# Patient Record
Sex: Female | Born: 1935 | Race: White | Hispanic: No | Marital: Single | Smoking: Never smoker
Health system: Southern US, Community
[De-identification: ages and names within clinical notes are randomized; demographics above are authoritative.]

## PROBLEM LIST (undated history)

## (undated) DIAGNOSIS — C8590 Non-Hodgkin lymphoma, unspecified, unspecified site: Secondary | ICD-10-CM

## (undated) DIAGNOSIS — I4891 Unspecified atrial fibrillation: Secondary | ICD-10-CM

---

## 2017-09-24 ENCOUNTER — Encounter (HOSPITAL_COMMUNITY): Payer: Self-pay | Admitting: Emergency Medicine

## 2017-09-24 ENCOUNTER — Emergency Department (HOSPITAL_COMMUNITY): Payer: Medicare Other

## 2017-09-24 ENCOUNTER — Emergency Department: Payer: Self-pay

## 2017-09-24 ENCOUNTER — Inpatient Hospital Stay (HOSPITAL_COMMUNITY)
Admission: EM | Admit: 2017-09-24 | Discharge: 2017-09-30 | DRG: 563 | Disposition: A | Discharge status: Skilled Nursing Facility | Payer: Medicare Other | Attending: Internal Medicine | Admitting: Internal Medicine

## 2017-09-24 DIAGNOSIS — E871 Hypo-osmolality and hyponatremia: Secondary | ICD-10-CM | POA: Diagnosis present

## 2017-09-24 DIAGNOSIS — Z419 Encounter for procedure for purposes other than remedying health state, unspecified: Secondary | ICD-10-CM

## 2017-09-24 DIAGNOSIS — R52 Pain, unspecified: Secondary | ICD-10-CM | POA: Diagnosis not present

## 2017-09-24 DIAGNOSIS — R600 Localized edema: Secondary | ICD-10-CM | POA: Diagnosis present

## 2017-09-24 DIAGNOSIS — S42292K Other displaced fracture of upper end of left humerus, subsequent encounter for fracture with nonunion: Secondary | ICD-10-CM | POA: Diagnosis present

## 2017-09-24 DIAGNOSIS — Z881 Allergy status to other antibiotic agents status: Secondary | ICD-10-CM

## 2017-09-24 DIAGNOSIS — I4891 Unspecified atrial fibrillation: Secondary | ICD-10-CM | POA: Diagnosis present

## 2017-09-24 DIAGNOSIS — D696 Thrombocytopenia, unspecified: Secondary | ICD-10-CM | POA: Diagnosis present

## 2017-09-24 DIAGNOSIS — S4292XA Fracture of left shoulder girdle, part unspecified, initial encounter for closed fracture: Secondary | ICD-10-CM

## 2017-09-24 DIAGNOSIS — D62 Acute posthemorrhagic anemia: Secondary | ICD-10-CM | POA: Diagnosis not present

## 2017-09-24 DIAGNOSIS — S42292A Other displaced fracture of upper end of left humerus, initial encounter for closed fracture: Principal | ICD-10-CM | POA: Diagnosis present

## 2017-09-24 DIAGNOSIS — W010XXA Fall on same level from slipping, tripping and stumbling without subsequent striking against object, initial encounter: Secondary | ICD-10-CM | POA: Diagnosis present

## 2017-09-24 DIAGNOSIS — I482 Chronic atrial fibrillation: Secondary | ICD-10-CM | POA: Diagnosis present

## 2017-09-24 DIAGNOSIS — Z7901 Long term (current) use of anticoagulants: Secondary | ICD-10-CM

## 2017-09-24 DIAGNOSIS — R791 Abnormal coagulation profile: Secondary | ICD-10-CM | POA: Diagnosis present

## 2017-09-24 HISTORY — DX: Non-Hodgkin lymphoma, unspecified, unspecified site: C85.90

## 2017-09-24 HISTORY — DX: Unspecified atrial fibrillation: I48.91

## 2017-09-24 MED ORDER — ACETAMINOPHEN 500 MG PO TABS
1000.0000 mg | ORAL_TABLET | Freq: Once | ORAL | Status: AC
  Administered 2017-09-24: 1000 mg via ORAL
  Filled 2017-09-24: qty 2

## 2017-09-24 NOTE — ED Notes (Signed)
ED Provider at bedside. 

## 2017-09-24 NOTE — ED Triage Notes (Signed)
Patient seen at family physician today after falling yesterday.  C/o pain in left shoulder.  Bruising noted to left eye.  Patient is on coumadin.  Per family left eye looks better today than yesterday.  Xray done at PCP.  Was told to come here for orthopedic care.

## 2017-09-24 NOTE — ED Provider Notes (Signed)
TIME SEEN: 11:02 PM  CHIEF COMPLAINT: fall  HPI: Patient is a 83 year old female with history of atrial fibrillation on Coumadin who presents to the emergency department after she had a fall on Thursday morning November 29.  States that she fell because she dropped something on the floor and bent over to pick it up and lost her balance.  She did strike her head and fell onto her left side.  Was seen by her PCP Dr. settle in [place].  Had an x-ray of the left arm which showed a humerus fracture and was sent to the emergency department for orthopedic evaluation.  She denies any severe headache, neck or back pain.  No chest pain.  No abdominal pain.  No right arm pain or bilateral lower extremity pain.  No numbness or focal weakness.  She lives at home alone.  She is ambulatory without assistance.  She is right-hand dominant.  She does not have a local orthopedist.  She has been n.p.o. since earlier today.  She is currently in a sling.  ROS: See HPI Constitutional: no fever  Eyes: no drainage  ENT: no runny nose   Cardiovascular:  no chest pain  Resp: no SOB  GI: no vomiting GU: no dysuria Integumentary: no rash  Allergy: no hives  Musculoskeletal: no leg swelling  Neurological: no slurred speech ROS otherwise negative  PAST MEDICAL HISTORY/PAST SURGICAL HISTORY:  History reviewed. No pertinent past medical history.  MEDICATIONS:  Prior to Admission medications   Medication Sig Start Date End Date Taking? Authorizing Provider  COUMADIN 2 MG tablet Take 2 mg by mouth daily.  07/30/17  Yes [provider]  furosemide (LASIX) 20 MG tablet Take 20-40 mg by mouth See admin instructions. Alternate 20 mg and 40 mg every other day   Yes [provider]  TOPROL XL 100 MG 24 hr tablet Take 50 mg by mouth 2 (two) times daily. 07/30/17  Yes [provider]    ALLERGIES:  Allergies  Allergen Reactions  . Azithromycin Other (See Comments)    unknown  .  Ciprofloxacin     Felt like her head was on fire    SOCIAL HISTORY:  Social History   Tobacco Use  . Smoking status: Never Smoker  . Smokeless tobacco: Never Used  Substance Use Topics  . Alcohol use: No    Frequency: Never    FAMILY HISTORY: No family history on file.  EXAM: BP 135/69   Pulse 73   Temp 97.6 F (36.4 C) (Oral)   Resp 20   Ht 5\' 6"  (1.676 m)   Wt 54.4 kg (120 lb)   SpO2 100%   BMI 19.37 kg/m  CONSTITUTIONAL: Alert and oriented and responds appropriately to questions. Well-appearing; well-nourished; GCS 40, elderly HEAD: Normocephalic; ecchymosis around the left eye EYES: Conjunctivae clear, PERRL, EOMI ENT: normal nose; no rhinorrhea; moist mucous membranes; pharynx without lesions noted; no septal hematoma, patient is missing her upper right incisor but she states this is from a previous injury, no new dental injury NECK: Supple, no meningismus, no LAD; no midline spinal tenderness, step-off or deformity; trachea midline CARD: Irregularly irregular; S1 and S2 appreciated; no murmurs, no clicks, no rubs, no gallops RESP: Normal chest excursion without splinting or tachypnea; breath sounds clear and equal bilaterally; no wheezes, no rhonchi, no rales; no hypoxia or respiratory distress CHEST:  chest wall stable, no crepitus or deformity, nontender to palpation; no flail chest, ecchymosis across the anterior chest ABD/GI:  Normal bowel sounds; non-distended; soft, non-tender, no rebound, no guarding; no ecchymosis or other lesions noted PELVIS:  stable, nontender to palpation BACK:  The back appears normal and is non-tender to palpation, there is no CVA tenderness; no midline spinal tenderness, step-off or deformity EXT: Patient has deformity and tenderness over the proximal left humerus with ecchymosis.  Decreased range of motion in this joint secondary to pain.  Otherwise normal ROM in all joints; otherwise extremities are non-tender to palpation; no edema;  normal capillary refill; no cyanosis, no tenderness over the left elbow, forearm, wrist or hand.  Normal grip strength bilaterally. No joint effusion, compartments are soft, extremities are warm and well-perfused 2+ radial pulses bilaterally SKIN: Normal color for age and race; warm NEURO: Moves all extremities equally, reports normal sensation diffusely, cranial nerves II through XII intact, normal speech PSYCH: The patient's mood and manner are appropriate. Grooming and personal hygiene are appropriate.  MEDICAL DECISION MAKING: Patient with mechanical fall that occurred yesterday.  CT of the head and cervical spine showed no acute abnormality.  She is on Coumadin.  Patient has a left comminuted and markedly displaced left humeral head and neck fracture.  No other sign of injury on exam.  She does have ecchymosis across her chest but no tenderness and I suspect that this is secondary to her humerus fracture.  She is asking for Tylenol for pain.  We will keep her n.p.o. and discuss with orthopedics for their recommendations.  ED PROGRESS: 11:45 PM  D/w Dr. Erlinda Hong with orthopedics who has reviewed patient's imaging.  Appreciate his help.  He states patient will likely need a shoulder replacement.  Recommends admission to the hospitalist.  They will see patient in consult in the morning.  Will obtain screening labs, EKG, chest x-ray.  He states patient is okay to eat and drink as the surgery would not likely be until Monday.  Updated patient and son with this plan.  Will call hospitalist once workup has been completed.   2:14 AM Discussed patient's case with hospitalist, Dr. Alcario Drought.  I have recommended admission and patient (and family if present) agree with this plan. Admitting physician will place admission orders.   I reviewed all nursing notes, vitals, pertinent previous records, EKGs, lab and urine results, imaging (as available).       EKG Interpretation  Date/Time:  Saturday September 25 2017  00:21:53 EST Ventricular Rate:  78 PR Interval:    QRS Duration: 96 QT Interval:  402 QTC Calculation: 458 R Axis:   -43 Text Interpretation:  Atrial fibrillation Left axis deviation Low voltage, precordial leads RSR' in V1 or V2, probably normal variant Confirmed by Leighanne Adolph, Cyril Mourning [phone]) on 09/25/2017 1:41:20 AM         Tobias Avitabile, Delice Bison, DO 09/25/17 0215

## 2017-09-24 NOTE — ED Notes (Signed)
Patient transported to X-ray 

## 2017-09-25 ENCOUNTER — Encounter (HOSPITAL_COMMUNITY): Payer: Self-pay | Admitting: Internal Medicine

## 2017-09-25 ENCOUNTER — Inpatient Hospital Stay (HOSPITAL_COMMUNITY): Payer: Medicare Other

## 2017-09-25 ENCOUNTER — Emergency Department (HOSPITAL_COMMUNITY): Payer: Medicare Other

## 2017-09-25 DIAGNOSIS — E871 Hypo-osmolality and hyponatremia: Secondary | ICD-10-CM | POA: Diagnosis present

## 2017-09-25 DIAGNOSIS — I4891 Unspecified atrial fibrillation: Secondary | ICD-10-CM | POA: Diagnosis present

## 2017-09-25 DIAGNOSIS — R600 Localized edema: Secondary | ICD-10-CM | POA: Diagnosis present

## 2017-09-25 DIAGNOSIS — R52 Pain, unspecified: Secondary | ICD-10-CM | POA: Diagnosis present

## 2017-09-25 DIAGNOSIS — S42292A Other displaced fracture of upper end of left humerus, initial encounter for closed fracture: Secondary | ICD-10-CM | POA: Diagnosis present

## 2017-09-25 DIAGNOSIS — Z881 Allergy status to other antibiotic agents status: Secondary | ICD-10-CM | POA: Diagnosis not present

## 2017-09-25 DIAGNOSIS — S4292XD Fracture of left shoulder girdle, part unspecified, subsequent encounter for fracture with routine healing: Secondary | ICD-10-CM | POA: Diagnosis not present

## 2017-09-25 DIAGNOSIS — S42212A Unspecified displaced fracture of surgical neck of left humerus, initial encounter for closed fracture: Secondary | ICD-10-CM | POA: Diagnosis not present

## 2017-09-25 DIAGNOSIS — D696 Thrombocytopenia, unspecified: Secondary | ICD-10-CM | POA: Diagnosis present

## 2017-09-25 DIAGNOSIS — D62 Acute posthemorrhagic anemia: Secondary | ICD-10-CM | POA: Diagnosis not present

## 2017-09-25 DIAGNOSIS — E876 Hypokalemia: Secondary | ICD-10-CM | POA: Diagnosis not present

## 2017-09-25 DIAGNOSIS — Z7901 Long term (current) use of anticoagulants: Secondary | ICD-10-CM | POA: Diagnosis not present

## 2017-09-25 DIAGNOSIS — I10 Essential (primary) hypertension: Secondary | ICD-10-CM | POA: Diagnosis not present

## 2017-09-25 DIAGNOSIS — I482 Chronic atrial fibrillation: Secondary | ICD-10-CM | POA: Diagnosis present

## 2017-09-25 DIAGNOSIS — S42292K Other displaced fracture of upper end of left humerus, subsequent encounter for fracture with nonunion: Secondary | ICD-10-CM | POA: Diagnosis present

## 2017-09-25 DIAGNOSIS — W010XXA Fall on same level from slipping, tripping and stumbling without subsequent striking against object, initial encounter: Secondary | ICD-10-CM | POA: Diagnosis present

## 2017-09-25 DIAGNOSIS — R791 Abnormal coagulation profile: Secondary | ICD-10-CM | POA: Diagnosis present

## 2017-09-25 LAB — APTT: APTT: 50 s — AB (ref 24–36)

## 2017-09-25 LAB — BASIC METABOLIC PANEL
ANION GAP: 7 (ref 5–15)
BUN: 16 mg/dL (ref 6–20)
CO2: 26 mmol/L (ref 22–32)
Calcium: 8.9 mg/dL (ref 8.9–10.3)
Chloride: 94 mmol/L — ABNORMAL LOW (ref 101–111)
Creatinine, Ser: 0.63 mg/dL (ref 0.44–1.00)
GLUCOSE: 116 mg/dL — AB (ref 65–99)
POTASSIUM: 3.9 mmol/L (ref 3.5–5.1)
Sodium: 127 mmol/L — ABNORMAL LOW (ref 135–145)

## 2017-09-25 LAB — CBC WITH DIFFERENTIAL/PLATELET
BASOS ABS: 0 10*3/uL (ref 0.0–0.1)
Basophils Relative: 0 %
Eosinophils Absolute: 0 10*3/uL (ref 0.0–0.7)
Eosinophils Relative: 0 %
HEMATOCRIT: 25 % — AB (ref 36.0–46.0)
HEMOGLOBIN: 8.8 g/dL — AB (ref 12.0–15.0)
LYMPHS PCT: 17 %
Lymphs Abs: 0.9 10*3/uL (ref 0.7–4.0)
MCH: 30.9 pg (ref 26.0–34.0)
MCHC: 35.2 g/dL (ref 30.0–36.0)
MCV: 87.7 fL (ref 78.0–100.0)
Monocytes Absolute: 0.7 10*3/uL (ref 0.1–1.0)
Monocytes Relative: 12 %
NEUTROS ABS: 3.8 10*3/uL (ref 1.7–7.7)
NEUTROS PCT: 70 %
Platelets: 106 10*3/uL — ABNORMAL LOW (ref 150–400)
RBC: 2.85 MIL/uL — AB (ref 3.87–5.11)
RDW: 14.1 % (ref 11.5–15.5)
WBC: 5.4 10*3/uL (ref 4.0–10.5)

## 2017-09-25 LAB — URINALYSIS, ROUTINE W REFLEX MICROSCOPIC
Bacteria, UA: NONE SEEN
Bilirubin Urine: NEGATIVE
Glucose, UA: NEGATIVE mg/dL
HGB URINE DIPSTICK: NEGATIVE
Ketones, ur: NEGATIVE mg/dL
Nitrite: NEGATIVE
PROTEIN: NEGATIVE mg/dL
SPECIFIC GRAVITY, URINE: 1.024 (ref 1.005–1.030)
pH: 5 (ref 5.0–8.0)

## 2017-09-25 LAB — ABO/RH: ABO/RH(D): O POS

## 2017-09-25 LAB — PROTIME-INR
INR: 2.79
Prothrombin Time: 29.2 seconds — ABNORMAL HIGH (ref 11.4–15.2)

## 2017-09-25 LAB — BRAIN NATRIURETIC PEPTIDE: B NATRIURETIC PEPTIDE 5: 132 pg/mL — AB (ref 0.0–100.0)

## 2017-09-25 LAB — TROPONIN I: TROPONIN I: 0.03 ng/mL — AB (ref <0.03)

## 2017-09-25 MED ORDER — FUROSEMIDE 20 MG PO TABS
20.0000 mg | ORAL_TABLET | ORAL | Status: DC
  Administered 2017-09-26 – 2017-09-30 (×3): 20 mg via ORAL
  Filled 2017-09-25 (×3): qty 1

## 2017-09-25 MED ORDER — HYDROCODONE-ACETAMINOPHEN 5-325 MG PO TABS
1.0000 | ORAL_TABLET | Freq: Four times a day (QID) | ORAL | Status: DC | PRN
  Administered 2017-09-25 – 2017-09-26 (×4): 2 via ORAL
  Administered 2017-09-26: 1 via ORAL
  Administered 2017-09-27 (×2): 2 via ORAL
  Administered 2017-09-27: 1 via ORAL
  Administered 2017-09-28 – 2017-09-30 (×9): 2 via ORAL
  Filled 2017-09-25 (×2): qty 2
  Filled 2017-09-25: qty 1
  Filled 2017-09-25 (×4): qty 2
  Filled 2017-09-25: qty 1
  Filled 2017-09-25 (×9): qty 2
  Filled 2017-09-25: qty 1
  Filled 2017-09-25: qty 2

## 2017-09-25 MED ORDER — ONDANSETRON HCL 4 MG PO TABS: 4.0000 mg | ORAL_TABLET | Freq: Four times a day (QID) | ORAL | Status: DC | PRN

## 2017-09-25 MED ORDER — ACETAMINOPHEN 325 MG PO TABS
650.0000 mg | ORAL_TABLET | Freq: Four times a day (QID) | ORAL | Status: DC | PRN
  Administered 2017-09-27: 650 mg via ORAL
  Filled 2017-09-25: qty 2

## 2017-09-25 MED ORDER — ACETAMINOPHEN 650 MG RE SUPP
650.0000 mg | Freq: Four times a day (QID) | RECTAL | Status: DC | PRN
Start: 2017-09-25 — End: 2017-09-30

## 2017-09-25 MED ORDER — FUROSEMIDE 40 MG PO TABS
40.0000 mg | ORAL_TABLET | ORAL | Status: DC
  Administered 2017-09-25 – 2017-09-27 (×2): 40 mg via ORAL
  Filled 2017-09-25 (×3): qty 1

## 2017-09-25 MED ORDER — METOPROLOL SUCCINATE ER 50 MG PO TB24
50.0000 mg | ORAL_TABLET | Freq: Two times a day (BID) | ORAL | Status: DC
  Administered 2017-09-25 – 2017-09-30 (×9): 50 mg via ORAL
  Filled 2017-09-25 (×10): qty 1

## 2017-09-25 MED ORDER — ONDANSETRON HCL 4 MG/2ML IJ SOLN: 4.0000 mg | Freq: Four times a day (QID) | INTRAMUSCULAR | Status: DC | PRN

## 2017-09-25 MED ORDER — METOPROLOL SUCCINATE ER 50 MG PO TB24
50.0000 mg | ORAL_TABLET | Freq: Two times a day (BID) | ORAL | Status: DC
  Filled 2017-09-25 (×2): qty 1

## 2017-09-25 NOTE — Progress Notes (Signed)
xrays reviewed inr 2.79 - too high now for surgery Ct left shoulder ordered

## 2017-09-25 NOTE — Consult Note (Signed)
ORTHOPAEDIC CONSULTATION  REQUESTING PHYSICIAN: Kayleen Memos, DO  Chief Complaint: Left surgical neck humerus fx  HPI: Judith Hunter is a 83 y.o. female who presents with left surgical neck humerus fx s/p mechanical fall Thursday.  Denies LOC or syncope.  Presented to PCP and was told to go to ED.  Denies numbness, neck pain, abd pain.  Trauma work up negative. Ortho consulted for surgical treatment and preop medical optimization.  Past Medical History:  Diagnosis Date  . A-fib ([place])   . Lymphoma ([place])    Remission for ~5 years now per patient (looks more like 11 years remission by [facility] notes)   History reviewed. No pertinent surgical history. Social History   Socioeconomic History  . Marital status: Single    Spouse name: None  . Number of children: None  . Years of education: None  . Highest education level: None  Social Needs  . Financial resource strain: None  . Food insecurity - worry: None  . Food insecurity - inability: None  . Transportation needs - medical: None  . Transportation needs - non-medical: None  Occupational History  . None  Tobacco Use  . Smoking status: Never Smoker  . Smokeless tobacco: Never Used  Substance and Sexual Activity  . Alcohol use: No    Frequency: Never  . Drug use: No  . Sexual activity: No  Other Topics Concern  . None  Social History Narrative  . None   No family history on file. - negative except otherwise stated in the family history section Allergies  Allergen Reactions  . Azithromycin Other (See Comments)    unknown  . Ciprofloxacin     Felt like her head was on fire   Prior to Admission medications   Medication Sig Start Date End Date Taking? Authorizing Provider  COUMADIN 2 MG tablet Take 2 mg by mouth daily.  07/30/17  Yes [provider]  furosemide (LASIX) 20 MG tablet Take 20-40 mg by mouth See admin instructions. Alternate 20 mg and 40 mg every other day   Yes [provider]    TOPROL XL 100 MG 24 hr tablet Take 50 mg by mouth 2 (two) times daily. 07/30/17  Yes [provider]   Dg Chest 2 View  Result Date: 09/25/2017 CLINICAL DATA:  Preop left humerus fracture. EXAM: CHEST  2 VIEW COMPARISON:  Chest radiograph yesterday FINDINGS: Stable cardiomegaly. Unchanged diffuse coarse interstitial opacities, probable peripheral scarring in the upper lungs. Possible small pleural effusions. No pneumothorax. Displaced left proximal humerus fracture. Bones are under mineralized. Compression fracture in the lower thoracic spine, age indeterminate. IMPRESSION: 1. Cardiomegaly. Coarse interstitial opacities may be chronic bronchitic change or pulmonary edema. Small pleural effusions. Findings are unchanged from radiograph yesterday. 2. Displaced left proximal humerus fracture. Compression deformity in the lower thoracic spine, age indeterminate. Electronically Signed   By: Jeb Levering M.D.   On: 09/25/2017 00:42   Ct Head Wo Contrast  Result Date: 09/24/2017 CLINICAL DATA:  83 y/o F; fall with head injury and bruising to the left eye. EXAM: CT HEAD WITHOUT CONTRAST CT CERVICAL SPINE WITHOUT CONTRAST TECHNIQUE: Multidetector CT imaging of the head and cervical spine was performed following the standard protocol without intravenous contrast. Multiplanar CT image reconstructions of the cervical spine were also generated. COMPARISON:  None. FINDINGS: CT HEAD FINDINGS Brain: No evidence of acute infarction, hemorrhage, hydrocephalus, extra-axial collection or mass lesion/mass effect. Mild chronic microvascular ischemic changes and parenchymal volume loss  of the brain. Vascular: Calcific atherosclerosis of carotid siphons and vertebrobasilar system. Skull: Normal. Negative for fracture or focal lesion. Sinuses/Orbits: No acute finding. 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 CT  CERVICAL SPINE FINDINGS Alignment: Grade 1 C4-5, C7-T1, and T1-2 anterolisthesis. Straightening of cervical lordosis. Skull base and vertebrae: No acute fracture. No primary bone lesion or focal pathologic process. Soft tissues and spinal canal: No prevertebral fluid or swelling. No visible canal hematoma. Disc levels: Moderate discogenic degenerative changes at the C5-C7 levels with loss of disc space height and endplate sclerosis. Prominent upper cervical facet arthrosis. Uncovertebral and facet hypertrophy encroaches on the neural foramen bilaterally greatest at the C5-6 and C6-7 levels. Upper chest: Small left pleural effusion. Other: Calcific atherosclerosis of carotid siphons. Edema and left supraclavicular fossa and axilla. IMPRESSION: CT head: 1. No acute intracranial abnormality or calvarial fracture. 2. Mild chronic microvascular ischemic changes and parenchymal volume loss of the brain. 3. TMJ osteoarthrosis. CT cervical spine: 1. No acute fracture or dislocation identified. 2. Moderate cervical spondylosis greatest at C5-C7 levels. 3. Small left pleural effusion and edema within the left supraclavicular fossa and axilla partially visualized. Electronically Signed   By: Kristine Garbe M.D.   On: 09/24/2017 20:50   Ct Cervical Spine Wo Contrast  Result Date: 09/24/2017 CLINICAL DATA:  83 y/o F; fall with head injury and bruising to the left eye. EXAM: CT HEAD WITHOUT CONTRAST CT CERVICAL SPINE WITHOUT CONTRAST TECHNIQUE: Multidetector CT imaging of the head and cervical spine was performed following the standard protocol without intravenous contrast. Multiplanar CT image reconstructions of the cervical spine were also generated. COMPARISON:  None. FINDINGS: CT HEAD FINDINGS Brain: No evidence of acute infarction, hemorrhage, hydrocephalus, extra-axial collection or mass lesion/mass effect. Mild chronic microvascular ischemic changes and parenchymal volume loss of the brain. Vascular:  Calcific atherosclerosis of carotid siphons and vertebrobasilar system. Skull: Normal. Negative for fracture or focal lesion. Sinuses/Orbits: No acute finding. 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 CT CERVICAL SPINE FINDINGS Alignment: Grade 1 C4-5, C7-T1, and T1-2 anterolisthesis. Straightening of cervical lordosis. Skull base and vertebrae: No acute fracture. No primary bone lesion or focal pathologic process. Soft tissues and spinal canal: No prevertebral fluid or swelling. No visible canal hematoma. Disc levels: Moderate discogenic degenerative changes at the C5-C7 levels with loss of disc space height and endplate sclerosis. Prominent upper cervical facet arthrosis. Uncovertebral and facet hypertrophy encroaches on the neural foramen bilaterally greatest at the C5-6 and C6-7 levels. Upper chest: Small left pleural effusion. Other: Calcific atherosclerosis of carotid siphons. Edema and left supraclavicular fossa and axilla. IMPRESSION: CT head: 1. No acute intracranial abnormality or calvarial fracture. 2. Mild chronic microvascular ischemic changes and parenchymal volume loss of the brain. 3. TMJ osteoarthrosis. CT cervical spine: 1. No acute fracture or dislocation identified. 2. Moderate cervical spondylosis greatest at C5-C7 levels. 3. Small left pleural effusion and edema within the left supraclavicular fossa and axilla partially visualized. Electronically Signed   By: Kristine Garbe M.D.   On: 09/24/2017 20:50   Dg Humerus Left  Result Date: 09/24/2017 CLINICAL DATA:  Left upper arm pain, bruising and deformity following a fall yesterday. EXAM: LEFT HUMERUS - 2+ VIEW COMPARISON:  None. FINDINGS: Comminuted fracture of the left humeral head and neck with marked medial and posterior displacement of  the distal fragment. There is an exostosis arising from the distal humeral shaft.  Diffuse osteopenia. IMPRESSION: Comminuted and markedly displaced left humeral head and neck fracture. Electronically Signed   By: Claudie Revering M.D.   On: 09/24/2017 21:09   Dg Outside Films Extremity  Result Date: 09/24/2017 This examination belongs to an outside facility and is stored here for comparison purposes only.  Contact the originating outside institution for any associated report or interpretation.  - pertinent xrays, CT, MRI studies were reviewed and independently interpreted  Positive ROS: All other systems have been reviewed and were otherwise negative with the exception of those mentioned in the HPI and as above.  Physical Exam: General: Alert, no acute distress Cardiovascular: No pedal edema Respiratory: No cyanosis, no use of accessory musculature GI: No organomegaly, abdomen is soft and non-tender Skin: No lesions in the area of chief complaint Neurologic: Sensation intact distally Psychiatric: Patient is competent for consent with normal mood and affect Lymphatic: No axillary or cervical lymphadenopathy  MUSCULOSKELETAL:  - extensive bruising of the shoulder and arm - hand wwp - no neurovascular compromise  Assessment: Left displaced surgical humerus fx  Plan: - INR is too high for surgery at this time - CT ordered for preop surgical planning - my partner, Dr. Marlou Sa, will assume orthopedic care and surgery is tentatively scheduled for Monday vs Tuesday  Thank you for the consult and the opportunity to see Ms. Pinela  N. Eduard Roux, MD [place] 1:54 PM

## 2017-09-25 NOTE — ED Notes (Signed)
Admitting MD at bedside.

## 2017-09-25 NOTE — Plan of Care (Addendum)
Per son and patient - they are not sure if patient has taken Norco before.  RN educated that pain med may be needed to keep patient comfortable (since waiting till 12/3 for surgery.  Staff will continue to monitor side effects of Norco. RN agreed with son to discontinue use if mental status changes occur.

## 2017-09-25 NOTE — Progress Notes (Signed)
PROGRESS NOTE  Judith Hunter CXK:[id number] DOB: 03-22-1924 DOA: 09/24/2017 PCP: Patient, No Pcp Per  HPI/Recap of past 24 hours: Judith Hunter is a 83 yo CF with PMH chronic a-fib on coumadin, who had a mechanical fall on Thursday 3 days ago presented to her PCP who did xray revealing left surgical neck fracture. Patient presented to [facility] on 09/24/17 evening and was admitted on 09/25/17 for left humerus fracture needing repair. Ortho consulted however, due to elevated INR on coumadin, surgery was postponed to Monday 09/27/17 or Tuesday.  No acute  Events overnight. Continue to hold coumadin. When subtherapeutic keep on heparin drip. INR 2.79.  Assessment/Plan: Principal Problem:   Closed fracture of head of left humerus Active Problems:   A-fib [facility])  Acute Left surgical neck humerus fracture, pOA -ortho surgery following -surgery postponed due to elevated INR 2.79 on coumadin -Surgery possible Monday or Tuesday 09/28/17 -continue to hold coumadin -repeat INR am  Hyponatremia -unclear if chronic or acute -no prior record to compare -Na+ 127 -possibly 2/2 to intractable pain with left upper arm fracture for 3 days with no treatment vs chronic use of diuretic -fluid restriction -BMP am  Thrombocytopenia with unclear chronicity -no previous lab to compare -plt 106 -no sign of overt bleeding -plt 106 -cbc am  Chronic a-fib -stable -rate controlled -continue metoprolol -hold coumadin -inr 2.79 -INR am  Chronic lower extremity edema -stable -lasix 40 mg every other day -compression stockings   Code Status: Full  Family Communication: No family member at bedside.   Disposition Plan: Will stay another midnight with possible surgery on Monday or Tuesday 09/28/17.  Consultants:  Orthopedic surgery  Procedures:  Planned left surgical neck humerus fracture repair  Antimicrobials:  None indicated  DVT prophylaxis:  therapeutic INR 2.79. Hold coumadin  for planned surgery on Monday or tuesday   Objective: Vitals:   09/25/17 0300 09/25/17 0330 09/25/17 0414 09/25/17 0922  BP: 118/64 (!) 125/57 (!) 139/57 138/62  Pulse: 87 82 81 82  Resp: 16 (!) 22 20   Temp:   97.8 F (36.6 C)   TempSrc:   Oral   SpO2: 96% 95% 96%   Weight:      Height:        Intake/Output Summary (Last 24 hours) at 09/25/2017 1400 Last data filed at 09/25/2017 0900 Gross per 24 hour  Intake 240 ml  Output -  Net 240 ml   Filed Weights   09/24/17 1947  Weight: 54.4 kg (120 lb)    Exam:   General:  83 yo CF thin built laying in bed with left arm sling in NAD. A&O x 3  Cardiovascular: IRRR with no rubs or gallops  Respiratory: CTA no wheezes or ronchi  Abdomen: Soft NT ND NBSx 4 quadrants  Musculoskeletal: No LE edema. Edema noted on left hand  Skin:echymosis around left eye and left upper arm  Psychiatry: Mood appropriate for condition and setting   Data Reviewed: CBC: Recent Labs  Lab 09/25/17 0052  WBC 5.4  NEUTROABS 3.8  HGB 8.8*  HCT 25.0*  MCV 87.7  PLT 970*   Basic Metabolic Panel: Recent Labs  Lab 09/25/17 0052  NA 127*  K 3.9  CL 94*  CO2 26  GLUCOSE 116*  BUN 16  CREATININE 0.63  CALCIUM 8.9   GFR: Estimated Creatinine Clearance: 39.3 mL/min (by C-G formula based on SCr of 0.63 mg/dL). Liver Function Tests: No results for input(s): AST, ALT, ALKPHOS, BILITOT, PROT, ALBUMIN  in the last 168 hours. No results for input(s): LIPASE, AMYLASE in the last 168 hours. No results for input(s): AMMONIA in the last 168 hours. Coagulation Profile: Recent Labs  Lab 09/25/17 0052  INR 2.79   Cardiac Enzymes: Recent Labs  Lab 09/25/17 0052  TROPONINI 0.03*   BNP (last 3 results) No results for input(s): PROBNP in the last 8760 hours. HbA1C: No results for input(s): HGBA1C in the last 72 hours. CBG: No results for input(s): GLUCAP in the last 168 hours. Lipid Profile: No results for input(s): CHOL, HDL, LDLCALC,  TRIG, CHOLHDL, LDLDIRECT in the last 72 hours. Thyroid Function Tests: No results for input(s): TSH, T4TOTAL, FREET4, T3FREE, THYROIDAB in the last 72 hours. Anemia Panel: No results for input(s): VITAMINB12, FOLATE, FERRITIN, TIBC, IRON, RETICCTPCT in the last 72 hours. Urine analysis:    Component Value Date/Time   COLORURINE AMBER (A) 09/25/2017 0215   APPEARANCEUR HAZY (A) 09/25/2017 0215   LABSPEC 1.024 09/25/2017 0215   PHURINE 5.0 09/25/2017 0215   GLUCOSEU NEGATIVE 09/25/2017 0215   HGBUR NEGATIVE 09/25/2017 0215   BILIRUBINUR NEGATIVE 09/25/2017 0215   KETONESUR NEGATIVE 09/25/2017 0215   PROTEINUR NEGATIVE 09/25/2017 0215   NITRITE NEGATIVE 09/25/2017 0215   LEUKOCYTESUR MODERATE (A) 09/25/2017 0215   Sepsis Labs: @LABRCNTIP (procalcitonin:4,lacticidven:4)  )No results found for this or any previous visit (from the past 240 hour(s)).    Studies: Dg Chest 2 View  Result Date: 09/25/2017 CLINICAL DATA:  Preop left humerus fracture. EXAM: CHEST  2 VIEW COMPARISON:  Chest radiograph yesterday FINDINGS: Stable cardiomegaly. Unchanged diffuse coarse interstitial opacities, probable peripheral scarring in the upper lungs. Possible small pleural effusions. No pneumothorax. Displaced left proximal humerus fracture. Bones are under mineralized. Compression fracture in the lower thoracic spine, age indeterminate. IMPRESSION: 1. Cardiomegaly. Coarse interstitial opacities may be chronic bronchitic change or pulmonary edema. Small pleural effusions. Findings are unchanged from radiograph yesterday. 2. Displaced left proximal humerus fracture. Compression deformity in the lower thoracic spine, age indeterminate. Electronically Signed   By: Jeb Levering M.D.   On: 09/25/2017 00:42   Ct Head Wo Contrast  Result Date: 09/24/2017 CLINICAL DATA:  83 y/o F; fall with head injury and bruising to the left eye. EXAM: CT HEAD WITHOUT CONTRAST CT CERVICAL SPINE WITHOUT CONTRAST TECHNIQUE:  Multidetector CT imaging of the head and cervical spine was performed following the standard protocol without intravenous contrast. Multiplanar CT image reconstructions of the cervical spine were also generated. COMPARISON:  None. FINDINGS: CT HEAD FINDINGS Brain: No evidence of acute infarction, hemorrhage, hydrocephalus, extra-axial collection or mass lesion/mass effect. Mild chronic microvascular ischemic changes and parenchymal volume loss of the brain. Vascular: Calcific atherosclerosis of carotid siphons and vertebrobasilar system. Skull: Normal. Negative for fracture or focal lesion. Sinuses/Orbits: No acute finding. 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 CT CERVICAL SPINE FINDINGS Alignment: Grade 1 C4-5, C7-T1, and T1-2 anterolisthesis. Straightening of cervical lordosis. Skull base and vertebrae: No acute fracture. No primary bone lesion or focal pathologic process. Soft tissues and spinal canal: No prevertebral fluid or swelling. No visible canal hematoma. Disc levels: Moderate discogenic degenerative changes at the C5-C7 levels with loss of disc space height and endplate sclerosis. Prominent upper cervical facet arthrosis. Uncovertebral and facet hypertrophy encroaches on the neural foramen bilaterally greatest at the C5-6 and C6-7 levels. Upper chest: Small left pleural effusion. Other: Calcific atherosclerosis of carotid siphons. Edema and  left supraclavicular fossa and axilla. IMPRESSION: CT head: 1. No acute intracranial abnormality or calvarial fracture. 2. Mild chronic microvascular ischemic changes and parenchymal volume loss of the brain. 3. TMJ osteoarthrosis. CT cervical spine: 1. No acute fracture or dislocation identified. 2. Moderate cervical spondylosis greatest at C5-C7 levels. 3. Small left pleural effusion and edema within the left supraclavicular fossa and axilla partially  visualized. Electronically Signed   By: Kristine Garbe M.D.   On: 09/24/2017 20:50   Ct Cervical Spine Wo Contrast  Result Date: 09/24/2017 CLINICAL DATA:  83 y/o F; fall with head injury and bruising to the left eye. EXAM: CT HEAD WITHOUT CONTRAST CT CERVICAL SPINE WITHOUT CONTRAST TECHNIQUE: Multidetector CT imaging of the head and cervical spine was performed following the standard protocol without intravenous contrast. Multiplanar CT image reconstructions of the cervical spine were also generated. COMPARISON:  None. FINDINGS: CT HEAD FINDINGS Brain: No evidence of acute infarction, hemorrhage, hydrocephalus, extra-axial collection or mass lesion/mass effect. Mild chronic microvascular ischemic changes and parenchymal volume loss of the brain. Vascular: Calcific atherosclerosis of carotid siphons and vertebrobasilar system. Skull: Normal. Negative for fracture or focal lesion. Sinuses/Orbits: No acute finding. 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 CT CERVICAL SPINE FINDINGS Alignment: Grade 1 C4-5, C7-T1, and T1-2 anterolisthesis. Straightening of cervical lordosis. Skull base and vertebrae: No acute fracture. No primary bone lesion or focal pathologic process. Soft tissues and spinal canal: No prevertebral fluid or swelling. No visible canal hematoma. Disc levels: Moderate discogenic degenerative changes at the C5-C7 levels with loss of disc space height and endplate sclerosis. Prominent upper cervical facet arthrosis. Uncovertebral and facet hypertrophy encroaches on the neural foramen bilaterally greatest at the C5-6 and C6-7 levels. Upper chest: Small left pleural effusion. Other: Calcific atherosclerosis of carotid siphons. Edema and left supraclavicular fossa and axilla. IMPRESSION: CT head: 1. No acute intracranial abnormality or calvarial fracture. 2. Mild chronic microvascular  ischemic changes and parenchymal volume loss of the brain. 3. TMJ osteoarthrosis. CT cervical spine: 1. No acute fracture or dislocation identified. 2. Moderate cervical spondylosis greatest at C5-C7 levels. 3. Small left pleural effusion and edema within the left supraclavicular fossa and axilla partially visualized. Electronically Signed   By: Kristine Garbe M.D.   On: 09/24/2017 20:50   Dg Humerus Left  Result Date: 09/24/2017 CLINICAL DATA:  Left upper arm pain, bruising and deformity following a fall yesterday. EXAM: LEFT HUMERUS - 2+ VIEW COMPARISON:  None. FINDINGS: Comminuted fracture of the left humeral head and neck with marked medial and posterior displacement of the distal fragment. There is an exostosis arising from the distal humeral shaft. Diffuse osteopenia. IMPRESSION: Comminuted and markedly displaced left humeral head and neck fracture. Electronically Signed   By: Claudie Revering M.D.   On: 09/24/2017 21:09   Dg Outside Films Extremity  Result Date: 09/24/2017 This examination belongs to an outside facility and is stored here for comparison purposes only.  Contact the originating outside institution for any associated report or interpretation.   Scheduled Meds: . [START ON 09/26/2017] furosemide  20 mg Oral QODAY  . furosemide  40 mg Oral QODAY  . metoprolol succinate  50 mg Oral BID    Continuous Infusions:   LOS: 0 days     Kayleen Memos, MD [facility] Pager [phone]  If 7PM-7AM, please contact night-coverage [email or web address] Password [email or web address] 09/25/2017, 2:00 PM

## 2017-09-25 NOTE — Consult Note (Signed)
Reason for Consult left shoulder injury Referring Physician: Dr Olena Leatherwood [name] is an 83 y.o. female.  HPI: Judith Hunter is a 83 year old female who fell Thursday and injured her left arm.  Patient is on Coumadin for atrial fibrillation.  Current INR 2.8.  She denies any other orthopedic complaints other than left shoulder pain.  She is right-hand dominant.  She lives alone and does not use a walker.  Past Medical History:  Diagnosis Date  . A-fib ([place])   . Lymphoma ([place])    Remission for ~5 years now per patient (looks more like 11 years remission by [facility] onc notes)    History reviewed. No pertinent surgical history.  No family history on file.  Social History:  reports that  has never smoked. she has never used smokeless tobacco. She reports that she does not drink alcohol or use drugs.  Allergies:  Allergies  Allergen Reactions  . Azithromycin Other (See Comments)    unknown  . Ciprofloxacin     Felt like her head was on fire    Medications: I have reviewed the patient's current medications.  Results for orders placed or performed during the hospital encounter of 09/24/17 (from the past 48 hour(s))  CBC with Differential     Status: Abnormal   Collection Time: 09/25/17 12:52 AM  Result Value Ref Range   WBC 5.4 4.0 - 10.5 K/uL   RBC 2.85 (L) 3.87 - 5.11 MIL/uL   Hemoglobin 8.8 (L) 12.0 - 15.0 g/dL   HCT 25.0 (L) 36.0 - 46.0 %   MCV 87.7 78.0 - 100.0 fL   MCH 30.9 26.0 - 34.0 pg   MCHC 35.2 30.0 - 36.0 g/dL   RDW 14.1 11.5 - 15.5 %   Platelets 106 (L) 150 - 400 K/uL    Comment: REPEATED TO VERIFY SPECIMEN CHECKED FOR CLOTS PLATELET COUNT CONFIRMED BY SMEAR    Neutrophils Relative % 70 %   Neutro Abs 3.8 1.7 - 7.7 K/uL   Lymphocytes Relative 17 %   Lymphs Abs 0.9 0.7 - 4.0 K/uL   Monocytes Relative 12 %   Monocytes Absolute 0.7 0.1 - 1.0 K/uL   Eosinophils Relative 0 %   Eosinophils Absolute 0.0 0.0 - 0.7 K/uL   Basophils Relative 0 %   Basophils Absolute 0.0  0.0 - 0.1 K/uL  Basic metabolic panel     Status: Abnormal   Collection Time: 09/25/17 12:52 AM  Result Value Ref Range   Sodium 127 (L) 135 - 145 mmol/L   Potassium 3.9 3.5 - 5.1 mmol/L   Chloride 94 (L) 101 - 111 mmol/L   CO2 26 22 - 32 mmol/L   Glucose, Bld 116 (H) 65 - 99 mg/dL   BUN 16 6 - 20 mg/dL   Creatinine, Ser 0.63 0.44 - 1.00 mg/dL   Calcium 8.9 8.9 - 10.3 mg/dL   GFR calc non Af Amer >60 >60 mL/min   GFR calc Af Amer >60 >60 mL/min    Comment: (NOTE) The eGFR has been calculated using the CKD EPI equation. This calculation has not been validated in all clinical situations. eGFR's persistently <60 mL/min signify possible Chronic Kidney Disease.    Anion gap 7 5 - 15  Protime-INR     Status: Abnormal   Collection Time: 09/25/17 12:52 AM  Result Value Ref Range   Prothrombin Time 29.2 (H) 11.4 - 15.2 seconds   INR 2.79   APTT     Status: Abnormal  Collection Time: 09/25/17 12:52 AM  Result Value Ref Range   aPTT 50 (H) 24 - 36 seconds    Comment:        IF BASELINE aPTT IS ELEVATED, SUGGEST PATIENT RISK ASSESSMENT BE USED TO DETERMINE APPROPRIATE ANTICOAGULANT THERAPY.   Brain natriuretic peptide     Status: Abnormal   Collection Time: 09/25/17 12:52 AM  Result Value Ref Range   B Natriuretic Peptide 132.0 (H) 0.0 - 100.0 pg/mL  Troponin I     Status: Abnormal   Collection Time: 09/25/17 12:52 AM  Result Value Ref Range   Troponin I 0.03 (HH) <0.03 ng/mL    Comment: CRITICAL RESULT CALLED TO, READ BACK BY AND VERIFIED WITH: CHERVENKA,C RN 09/25/2017 0216 JORDANS   Type and screen Ordered by PROVIDER DEFAULT     Status: None   Collection Time: 09/25/17  1:30 AM  Result Value Ref Range   ABO/RH(D) O POS    Antibody Screen NEG    Sample Expiration 09/28/2017   ABO/Rh     Status: None   Collection Time: 09/25/17  1:30 AM  Result Value Ref Range   ABO/RH(D) O POS   Urinalysis, Routine w reflex microscopic     Status: Abnormal   Collection Time: 09/25/17   2:15 AM  Result Value Ref Range   Color, Urine AMBER (A) YELLOW    Comment: BIOCHEMICALS MAY BE AFFECTED BY COLOR   APPearance HAZY (A) CLEAR   Specific Gravity, Urine 1.024 1.005 - 1.030   pH 5.0 5.0 - 8.0   Glucose, UA NEGATIVE NEGATIVE mg/dL   Hgb urine dipstick NEGATIVE NEGATIVE   Bilirubin Urine NEGATIVE NEGATIVE   Ketones, ur NEGATIVE NEGATIVE mg/dL   Protein, ur NEGATIVE NEGATIVE mg/dL   Nitrite NEGATIVE NEGATIVE   Leukocytes, UA MODERATE (A) NEGATIVE   RBC / HPF 0-5 0 - 5 RBC/hpf   WBC, UA 6-30 0 - 5 WBC/hpf   Bacteria, UA NONE SEEN NONE SEEN   Squamous Epithelial / LPF 0-5 (A) NONE SEEN   Mucus PRESENT    Hyaline Casts, UA PRESENT    Non Squamous Epithelial 0-5 (A) NONE SEEN    Dg Chest 2 View  Result Date: 09/25/2017 CLINICAL DATA:  Preop left humerus fracture. EXAM: CHEST  2 VIEW COMPARISON:  Chest radiograph yesterday FINDINGS: Stable cardiomegaly. Unchanged diffuse coarse interstitial opacities, probable peripheral scarring in the upper lungs. Possible small pleural effusions. No pneumothorax. Displaced left proximal humerus fracture. Bones are under mineralized. Compression fracture in the lower thoracic spine, age indeterminate. IMPRESSION: 1. Cardiomegaly. Coarse interstitial opacities may be chronic bronchitic change or pulmonary edema. Small pleural effusions. Findings are unchanged from radiograph yesterday. 2. Displaced left proximal humerus fracture. Compression deformity in the lower thoracic spine, age indeterminate. Electronically Signed   By: Jeb Levering M.D.   On: 09/25/2017 00:42   Ct Head Wo Contrast  Result Date: 09/24/2017 CLINICAL DATA:  83 y/o F; fall with head injury and bruising to the left eye. EXAM: CT HEAD WITHOUT CONTRAST CT CERVICAL SPINE WITHOUT CONTRAST TECHNIQUE: Multidetector CT imaging of the head and cervical spine was performed following the standard protocol without intravenous contrast. Multiplanar CT image reconstructions of the  cervical spine were also generated. COMPARISON:  None. FINDINGS: CT HEAD FINDINGS Brain: No evidence of acute infarction, hemorrhage, hydrocephalus, extra-axial collection or mass lesion/mass effect. Mild chronic microvascular ischemic changes and parenchymal volume loss of the brain. Vascular: Calcific atherosclerosis of carotid siphons and vertebrobasilar system. Skull: Normal.  Negative for fracture or focal lesion. Sinuses/Orbits: No acute finding. 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 CT CERVICAL SPINE FINDINGS Alignment: Grade 1 C4-5, C7-T1, and T1-2 anterolisthesis. Straightening of cervical lordosis. Skull base and vertebrae: No acute fracture. No primary bone lesion or focal pathologic process. Soft tissues and spinal canal: No prevertebral fluid or swelling. No visible canal hematoma. Disc levels: Moderate discogenic degenerative changes at the C5-C7 levels with loss of disc space height and endplate sclerosis. Prominent upper cervical facet arthrosis. Uncovertebral and facet hypertrophy encroaches on the neural foramen bilaterally greatest at the C5-6 and C6-7 levels. Upper chest: Small left pleural effusion. Other: Calcific atherosclerosis of carotid siphons. Edema and left supraclavicular fossa and axilla. IMPRESSION: CT head: 1. No acute intracranial abnormality or calvarial fracture. 2. Mild chronic microvascular ischemic changes and parenchymal volume loss of the brain. 3. TMJ osteoarthrosis. CT cervical spine: 1. No acute fracture or dislocation identified. 2. Moderate cervical spondylosis greatest at C5-C7 levels. 3. Small left pleural effusion and edema within the left supraclavicular fossa and axilla partially visualized. Electronically Signed   By: Kristine Garbe M.D.   On: 09/24/2017 20:50   Ct Cervical Spine Wo Contrast  Result Date: 09/24/2017 CLINICAL DATA:  83 y/o F;  fall with head injury and bruising to the left eye. EXAM: CT HEAD WITHOUT CONTRAST CT CERVICAL SPINE WITHOUT CONTRAST TECHNIQUE: Multidetector CT imaging of the head and cervical spine was performed following the standard protocol without intravenous contrast. Multiplanar CT image reconstructions of the cervical spine were also generated. COMPARISON:  None. FINDINGS: CT HEAD FINDINGS Brain: No evidence of acute infarction, hemorrhage, hydrocephalus, extra-axial collection or mass lesion/mass effect. Mild chronic microvascular ischemic changes and parenchymal volume loss of the brain. Vascular: Calcific atherosclerosis of carotid siphons and vertebrobasilar system. Skull: Normal. Negative for fracture or focal lesion. Sinuses/Orbits: No acute finding. 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 CT CERVICAL SPINE FINDINGS Alignment: Grade 1 C4-5, C7-T1, and T1-2 anterolisthesis. Straightening of cervical lordosis. Skull base and vertebrae: No acute fracture. No primary bone lesion or focal pathologic process. Soft tissues and spinal canal: No prevertebral fluid or swelling. No visible canal hematoma. Disc levels: Moderate discogenic degenerative changes at the C5-C7 levels with loss of disc space height and endplate sclerosis. Prominent upper cervical facet arthrosis. Uncovertebral and facet hypertrophy encroaches on the neural foramen bilaterally greatest at the C5-6 and C6-7 levels. Upper chest: Small left pleural effusion. Other: Calcific atherosclerosis of carotid siphons. Edema and left supraclavicular fossa and axilla. IMPRESSION: CT head: 1. No acute intracranial abnormality or calvarial fracture. 2. Mild chronic microvascular ischemic changes and parenchymal volume loss of the brain. 3. TMJ osteoarthrosis. CT cervical spine: 1. No acute fracture or dislocation identified. 2. Moderate cervical spondylosis  greatest at C5-C7 levels. 3. Small left pleural effusion and edema within the left supraclavicular fossa and axilla partially visualized. Electronically Signed   By: Kristine Garbe M.D.   On: 09/24/2017 20:50   Dg Humerus Left  Result Date: 09/24/2017 CLINICAL DATA:  Left upper arm pain, bruising and deformity following a fall yesterday. EXAM: LEFT HUMERUS - 2+ VIEW COMPARISON:  None. FINDINGS: Comminuted fracture of the left humeral head and neck with marked medial and posterior displacement of the distal fragment. There is an exostosis arising from the distal humeral shaft. Diffuse  osteopenia. IMPRESSION: Comminuted and markedly displaced left humeral head and neck fracture. Electronically Signed   By: Claudie Revering M.D.   On: 09/24/2017 21:09   Dg Outside Films Extremity  Result Date: 09/24/2017 This examination belongs to an outside facility and is stored here for comparison purposes only.  Contact the originating outside institution for any associated report or interpretation.   Review of Systems  Musculoskeletal: Positive for joint pain.  All other systems reviewed and are negative.  Blood pressure (!) 112/45, pulse 87, temperature 97.9 F (36.6 C), temperature source Oral, resp. rate 19, height '5\' 6"'  (1.676 m), weight 120 lb (54.4 kg), SpO2 95 %. Physical Exam   Constitutional: Patient appears well-developed HEENT: Patient slightly hard of hearing Head: Normocephalic -bruising over left eye Eyes:EOM are normal Neck: Normal range of motion Cardiovascular: Normal rate Pulmonary/chest: Effort normal Neurologic: Patient is alert Skin: Skin is warm Psychiatric: Patient has normal mood and affect Examination of the left shoulder demonstrates swelling in the arm region.  EPL FPL interosseous function is intact.  Fingers are perfused with cap refill less than 3 seconds and palpable radial pulse is felt through the soft tissue swelling in the left  arm  Assessment/Plan: Impression is displaced left proximal humerus fracture in a 83 year old patient who seems to be otherwise healthy.  I discussed with her at length the options for treatment.  Leaving this alone to heal is not going to be a great option for this otherwise active 83 year old patient.  The 3 other options for her would be an attempt at closed reduction.  Another second option would be open reduction internal fixation if closed reduction fails and third option would be reverse shoulder replacement.  I think that if we can get the shaft realigned with the head and have it stable I would probably give that a chance to heal first.  Based on the amount of bone left in the head fragment I do not think that open reduction internal fixation is a reliable or predictable option particularly given the fact that avascular necrosis is possible and likely based on the fracture pattern.  Third option would be reverse shoulder replacement which would be required if closed reduction is unsuccessful.  CT scan confirms the comminuted nature of the fracture but in general the head piece remains intact.  Greater tuberosity fracture has 3-4 mm of displacement but also remains intact.  Plan to pursue these options either Monday or Tuesday based on her INR.  Would like for that to be less than 1.5 before proceeding with surgical intervention  G Alphonzo Severance 09/25/2017, 4:06 PM

## 2017-09-25 NOTE — H&P (Signed)
History and Physical    Judith Hunter NFA:[id number] DOB: [birth year] DOA: 09/24/2017  PCP: Patient, No Pcp Per  Patient coming from: Home  I have personally briefly reviewed patient's old medical records in [place]  Chief Complaint: Shoulder pain  HPI: Judith Hunter is a 83 y.o. female with medical history significant of A.Fib rate controlled on coumadin.  Patient presents to the ED with c/o left shoulder pain following a fall on Thursday morning 11/29.  Patient was bending over to pick something up on the floor, lost balance and fell.  Did strike head and L arm.  Severe L arm pain following fall.  Went to PCP Dr. Tawny Asal in [place].  X ray performed which showed humerus fracture.  Patient was sent to ED for orthopedic evaluation.  Denies any severe headache, neck or back pain.  No R arm pain nor lower extremity pain.  At baseline ambulates without a walker.  Lives at home alone.   ED Course: CT head/neck neg.  Does have severely comminuted and displaced L humeral neck and head fx.  Dr. Erlinda Hong wants hospitalist to admit patient, plans on surgery on Monday.   Review of Systems: As per HPI otherwise 10 point review of systems negative.   Past Medical History:  Diagnosis Date  . A-fib ([place])   . Lymphoma ([place])    Remission for ~5 years now    History reviewed. No pertinent surgical history.   reports that  has never smoked. she has never used smokeless tobacco. She reports that she does not drink alcohol or use drugs.  Allergies  Allergen Reactions  . Azithromycin Other (See Comments)    unknown  . Ciprofloxacin     Felt like her head was on fire    No family history on file.   Prior to Admission medications   Medication Sig Start Date End Date Taking? Authorizing Provider  COUMADIN 2 MG tablet Take 2 mg by mouth daily.  07/30/17  Yes [provider]  furosemide (LASIX) 20 MG tablet Take 20-40 mg by mouth See admin instructions. Alternate 20 mg and 40 mg  every other day   Yes [provider]  TOPROL XL 100 MG 24 hr tablet Take 50 mg by mouth 2 (two) times daily. 07/30/17  Yes [provider]    Physical Exam: Vitals:   09/25/17 0000 09/25/17 0015 09/25/17 0030 09/25/17 0200  BP: 136/75 134/64 140/63 (!) 130/59  Pulse: 83 94 96 76  Resp:   (!) 23 16  Temp:      TempSrc:      SpO2: 97% 97% 97% 97%  Weight:      Height:        Constitutional: NAD, calm, comfortable Eyes: PERRL, lids and conjunctivae normal ENMT: Mucous membranes are moist. Posterior pharynx clear of any exudate or lesions.Normal dentition.  Neck: normal, supple, no masses, no thyromegaly Respiratory: clear to auscultation bilaterally, no wheezing, no crackles. Normal respiratory effort. No accessory muscle use.  Cardiovascular: Regular rate and rhythm, no murmurs / rubs / gallops. No extremity edema. 2+ pedal pulses. No carotid bruits.  Abdomen: no tenderness, no masses palpated. No hepatosplenomegaly. Bowel sounds positive.  Musculoskeletal: no clubbing / cyanosis. No joint deformity upper and lower extremities. Good ROM, no contractures. Normal muscle tone.  Skin: no rashes, lesions, ulcers. No induration Neurologic: CN 2-12 grossly intact. Sensation intact, DTR normal. Strength 5/5 in all 4.  Psychiatric: Normal judgment and insight. Alert and oriented x  3. Normal mood.    Labs on Admission: I have personally reviewed following labs and imaging studies  CBC: Recent Labs  Lab 09/25/17 0052  WBC 5.4  NEUTROABS 3.8  HGB 8.8*  HCT 25.0*  MCV 87.7  PLT 016*   Basic Metabolic Panel: Recent Labs  Lab 09/25/17 0052  NA 127*  K 3.9  CL 94*  CO2 26  GLUCOSE 116*  BUN 16  CREATININE 0.63  CALCIUM 8.9   GFR: Estimated Creatinine Clearance: 39.3 mL/min (by C-G formula based on SCr of 0.63 mg/dL). Liver Function Tests: No results for input(s): AST, ALT, ALKPHOS, BILITOT, PROT, ALBUMIN in the last 168 hours. No results for input(s):  LIPASE, AMYLASE in the last 168 hours. No results for input(s): AMMONIA in the last 168 hours. Coagulation Profile: Recent Labs  Lab 09/25/17 0052  INR 2.79   Cardiac Enzymes: Recent Labs  Lab 09/25/17 0052  TROPONINI 0.03*   BNP (last 3 results) No results for input(s): PROBNP in the last 8760 hours. HbA1C: No results for input(s): HGBA1C in the last 72 hours. CBG: No results for input(s): GLUCAP in the last 168 hours. Lipid Profile: No results for input(s): CHOL, HDL, LDLCALC, TRIG, CHOLHDL, LDLDIRECT in the last 72 hours. Thyroid Function Tests: No results for input(s): TSH, T4TOTAL, FREET4, T3FREE, THYROIDAB in the last 72 hours. Anemia Panel: No results for input(s): VITAMINB12, FOLATE, FERRITIN, TIBC, IRON, RETICCTPCT in the last 72 hours. Urine analysis: No results found for: COLORURINE, APPEARANCEUR, LABSPEC, PHURINE, GLUCOSEU, HGBUR, BILIRUBINUR, KETONESUR, PROTEINUR, UROBILINOGEN, NITRITE, LEUKOCYTESUR  Radiological Exams on Admission: Dg Chest 2 View  Result Date: 09/25/2017 CLINICAL DATA:  Preop left humerus fracture. EXAM: CHEST  2 VIEW COMPARISON:  Chest radiograph yesterday FINDINGS: Stable cardiomegaly. Unchanged diffuse coarse interstitial opacities, probable peripheral scarring in the upper lungs. Possible small pleural effusions. No pneumothorax. Displaced left proximal humerus fracture. Bones are under mineralized. Compression fracture in the lower thoracic spine, age indeterminate. IMPRESSION: 1. Cardiomegaly. Coarse interstitial opacities may be chronic bronchitic change or pulmonary edema. Small pleural effusions. Findings are unchanged from radiograph yesterday. 2. Displaced left proximal humerus fracture. Compression deformity in the lower thoracic spine, age indeterminate. Electronically Signed   By: Jeb Levering M.D.   On: 09/25/2017 00:42   Ct Head Wo Contrast  Result Date: 09/24/2017 CLINICAL DATA:  83 y/o F; fall with head injury and bruising to  the left eye. EXAM: CT HEAD WITHOUT CONTRAST CT CERVICAL SPINE WITHOUT CONTRAST TECHNIQUE: Multidetector CT imaging of the head and cervical spine was performed following the standard protocol without intravenous contrast. Multiplanar CT image reconstructions of the cervical spine were also generated. COMPARISON:  None. FINDINGS: CT HEAD FINDINGS Brain: No evidence of acute infarction, hemorrhage, hydrocephalus, extra-axial collection or mass lesion/mass effect. Mild chronic microvascular ischemic changes and parenchymal volume loss of the brain. Vascular: Calcific atherosclerosis of carotid siphons and vertebrobasilar system. Skull: Normal. Negative for fracture or focal lesion. Sinuses/Orbits: No acute finding. 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 CT CERVICAL SPINE FINDINGS Alignment: Grade 1 C4-5, C7-T1, and T1-2 anterolisthesis. Straightening of cervical lordosis. Skull base and vertebrae: No acute fracture. No primary bone lesion or focal pathologic process. Soft tissues and spinal canal: No prevertebral fluid or swelling. No visible canal hematoma. Disc levels: Moderate discogenic degenerative changes at the C5-C7 levels with loss of disc space height and endplate sclerosis. Prominent upper cervical facet arthrosis.  Uncovertebral and facet hypertrophy encroaches on the neural foramen bilaterally greatest at the C5-6 and C6-7 levels. Upper chest: Small left pleural effusion. Other: Calcific atherosclerosis of carotid siphons. Edema and left supraclavicular fossa and axilla. IMPRESSION: CT head: 1. No acute intracranial abnormality or calvarial fracture. 2. Mild chronic microvascular ischemic changes and parenchymal volume loss of the brain. 3. TMJ osteoarthrosis. CT cervical spine: 1. No acute fracture or dislocation identified. 2. Moderate cervical spondylosis greatest at C5-C7 levels. 3. Small left  pleural effusion and edema within the left supraclavicular fossa and axilla partially visualized. Electronically Signed   By: Kristine Garbe M.D.   On: 09/24/2017 20:50   Ct Cervical Spine Wo Contrast  Result Date: 09/24/2017 CLINICAL DATA:  83 y/o F; fall with head injury and bruising to the left eye. EXAM: CT HEAD WITHOUT CONTRAST CT CERVICAL SPINE WITHOUT CONTRAST TECHNIQUE: Multidetector CT imaging of the head and cervical spine was performed following the standard protocol without intravenous contrast. Multiplanar CT image reconstructions of the cervical spine were also generated. COMPARISON:  None. FINDINGS: CT HEAD FINDINGS Brain: No evidence of acute infarction, hemorrhage, hydrocephalus, extra-axial collection or mass lesion/mass effect. Mild chronic microvascular ischemic changes and parenchymal volume loss of the brain. Vascular: Calcific atherosclerosis of carotid siphons and vertebrobasilar system. Skull: Normal. Negative for fracture or focal lesion. Sinuses/Orbits: No acute finding. 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 CT CERVICAL SPINE FINDINGS Alignment: Grade 1 C4-5, C7-T1, and T1-2 anterolisthesis. Straightening of cervical lordosis. Skull base and vertebrae: No acute fracture. No primary bone lesion or focal pathologic process. Soft tissues and spinal canal: No prevertebral fluid or swelling. No visible canal hematoma. Disc levels: Moderate discogenic degenerative changes at the C5-C7 levels with loss of disc space height and endplate sclerosis. Prominent upper cervical facet arthrosis. Uncovertebral and facet hypertrophy encroaches on the neural foramen bilaterally greatest at the C5-6 and C6-7 levels. Upper chest: Small left pleural effusion. Other: Calcific atherosclerosis of carotid siphons. Edema and left supraclavicular fossa and axilla. IMPRESSION: CT head: 1. No  acute intracranial abnormality or calvarial fracture. 2. Mild chronic microvascular ischemic changes and parenchymal volume loss of the brain. 3. TMJ osteoarthrosis. CT cervical spine: 1. No acute fracture or dislocation identified. 2. Moderate cervical spondylosis greatest at C5-C7 levels. 3. Small left pleural effusion and edema within the left supraclavicular fossa and axilla partially visualized. Electronically Signed   By: Kristine Garbe M.D.   On: 09/24/2017 20:50   Dg Humerus Left  Result Date: 09/24/2017 CLINICAL DATA:  Left upper arm pain, bruising and deformity following a fall yesterday. EXAM: LEFT HUMERUS - 2+ VIEW COMPARISON:  None. FINDINGS: Comminuted fracture of the left humeral head and neck with marked medial and posterior displacement of the distal fragment. There is an exostosis arising from the distal humeral shaft. Diffuse osteopenia. IMPRESSION: Comminuted and markedly displaced left humeral head and neck fracture. Electronically Signed   By: Claudie Revering M.D.   On: 09/24/2017 21:09   Dg Outside Films Extremity  Result Date: 09/24/2017 This examination belongs to an outside facility and is stored here for comparison purposes only.  Contact the originating outside institution for any associated report or interpretation.   EKG: Independently reviewed.  Assessment/Plan Principal Problem:   Closed fracture of head of left humerus Active Problems:   A-fib ([facility])    1. Closed fx of head of L humerus - 1. Needs  operative repair 2. Dr. Erlinda Hong requests that we admit patient 3. Plans on shoulder replacement on Monday 2. A.Fib 1. Hold coumadin 2. Continue toprol  DVT prophylaxis: Theraputic on coumadin currently Code Status: Full Family Communication: Son at bedside Disposition Plan: Home after admit Consults called: Dr. Erlinda Hong Admission status: Admit to inpatient - fracture requiring operative repair   Etta Quill DO [facility] Pager  [phone]  If 7AM-7PM, please contact day team taking care of patient [email or web address] Password [email or web address]  09/25/2017, 2:30 AM

## 2017-09-26 DIAGNOSIS — I10 Essential (primary) hypertension: Secondary | ICD-10-CM

## 2017-09-26 LAB — CBC
HCT: 22.6 % — ABNORMAL LOW (ref 36.0–46.0)
Hemoglobin: 7.5 g/dL — ABNORMAL LOW (ref 12.0–15.0)
MCH: 29.9 pg (ref 26.0–34.0)
MCHC: 33.2 g/dL (ref 30.0–36.0)
MCV: 90 fL (ref 78.0–100.0)
PLATELETS: 109 10*3/uL — AB (ref 150–400)
RBC: 2.51 MIL/uL — AB (ref 3.87–5.11)
RDW: 14.7 % (ref 11.5–15.5)
WBC: 4.8 10*3/uL (ref 4.0–10.5)

## 2017-09-26 LAB — BASIC METABOLIC PANEL
Anion gap: 8 (ref 5–15)
BUN: 16 mg/dL (ref 6–20)
CHLORIDE: 93 mmol/L — AB (ref 101–111)
CO2: 26 mmol/L (ref 22–32)
Calcium: 8.3 mg/dL — ABNORMAL LOW (ref 8.9–10.3)
Creatinine, Ser: 0.58 mg/dL (ref 0.44–1.00)
GFR calc Af Amer: >60 mL/min (ref >60)
GLUCOSE: 118 mg/dL — AB (ref 65–99)
POTASSIUM: 3.8 mmol/L (ref 3.5–5.1)
Sodium: 127 mmol/L — ABNORMAL LOW (ref 135–145)

## 2017-09-26 LAB — PROTIME-INR
INR: 2.76
Prothrombin Time: 29 seconds — ABNORMAL HIGH (ref 11.4–15.2)

## 2017-09-26 LAB — PREPARE RBC (CROSSMATCH)

## 2017-09-26 MED ORDER — SODIUM CHLORIDE 0.9 % IV SOLN
Freq: Once | INTRAVENOUS | Status: AC
  Administered 2017-09-26: 18:00:00 via INTRAVENOUS

## 2017-09-26 MED ORDER — WHITE PETROLATUM EX OINT
TOPICAL_OINTMENT | CUTANEOUS | Status: AC
  Administered 2017-09-26: 19:00:00
  Filled 2017-09-26: qty 28.35

## 2017-09-26 NOTE — Care Management Note (Signed)
Case Management Note  Patient Details  Name: Cylah Fannin MRN: [id number] Date of Birth: 03/14/1924  Subjective/Objective:    Closed fracture of head of left humerus, scheduled surgery Tues or Wed, INR elevated                Action/Plan: Discharge Planning: Chart reviewed. Pt is scheduled for surgery to repair left humerus. Will have PT/OT evaluation for recommendations post surgery. SNF vs HH.    Expected Discharge Date:                  Expected Discharge Plan:  Skilled Nursing Facility  In-House Referral:  Clinical Social Work  Discharge planning Services  CM Consult  Post Acute Care Choice:  Home Health Choice offered to:  Patient  DME Arranged:    DME Agency:     HH Arranged:    [place] Agency:     Status of Service:  In process, will continue to follow  If discussed at Long Length of Stay Meetings, dates discussed:    Additional Comments:  Erenest Rasher, RN 09/26/2017, 5:57 PM

## 2017-09-26 NOTE — Progress Notes (Addendum)
PROGRESS NOTE  Judith Hunter LYY:[id number] DOB: 21-Dec-1923 DOA: 09/24/2017 PCP: Patient, No Pcp Per  HPI/Recap of past 24 hours: Ms. Judith Hunter is a 83 yo CF with PMH chronic a-fib on coumadin, who had a mechanical fall on Thursday 3 days ago presented to her PCP who did xray revealing left surgical neck fracture. Patient presented to [facility] on 09/24/17 evening and was admitted on 09/25/17 for left humerus fracture needing repair. Ortho consulted however, due to elevated INR on coumadin, surgery was postponed to Monday 09/27/17 or Tuesday.  No acute  Events overnight. Continue to hold coumadin. Goal INR 1.5 for surgery. Pt was seen and examined at her bedside. Pain is well controlled on current pain management.  Assessment/Plan: Principal Problem:   Closed fracture of head of left humerus Active Problems:   A-fib [facility])  Acute Left surgical neck humerus fracture, pOA -ortho surgery following -surgery postponed due to elevated INR 2.76 on coumadin -Surgery possible Monday or Tuesday 09/28/17 -continue to hold coumadin -repeat INR am -pain management  Hyponatremia -unclear if chronic or acute -no prior records to compare -Na+ 127 from 127 yesterday -possibly 2/2 to intractable pain with left upper arm fracture for 3 days with no treatment vs chronic use of diuretic -fluid restriction -BMP am  Normocytic anemia with unclear chronicity -no prior records to compare -Hg 7.5 from 8.8 -Repeat CBC am -If hg less than 7 or close to 7 transfuse 2U prbcs -type and screen today 09/26/17 and put 2 units on hold  Thrombocytopenia with unclear chronicity -no previous lab to compare -plt 109 from 106 -no sign of overt bleeding -cbc am  Chronic a-fib -stable -rate controlled -continue metoprolol -hold coumadin -inr 2.76 -INR am  Chronic lower extremity edema -stable -lasix 40 mg every other day -compression stockings   Code Status: Full  Family Communication: No family  member at bedside.   Disposition Plan: Will stay another midnight with possible surgery on Monday or Tuesday 09/28/17. Goal INR for surgery is 1.5  Consultants:  Orthopedic surgery  Procedures:  Planned left surgical neck humerus fracture repair  Antimicrobials:  None indicated  DVT prophylaxis:  therapeutic INR 2.76. Hold coumadin for planned surgery on Monday or Tuesday.    Objective: Vitals:   09/25/17 0922 09/25/17 1300 09/25/17 2123 09/26/17 0631  BP: 138/62 (!) 112/45 115/61 136/60  Pulse: 82 87 78 79  Resp:  19 16 16   Temp:  97.9 F (36.6 C) 98.5 F (36.9 C) 98.2 F (36.8 C)  TempSrc:  Oral Oral Oral  SpO2:  95% 92% 93%  Weight:      Height:        Intake/Output Summary (Last 24 hours) at 09/26/2017 1275 Last data filed at 09/25/2017 1300 Gross per 24 hour  Intake 360 ml  Output -  Net 360 ml   Filed Weights   09/24/17 1947  Weight: 54.4 kg (120 lb)    Exam:   General:  83 yo CF thin built laying in bed with left arm sling in NAD. A&O x 3  Cardiovascular: IRRR with no rubs or gallops  Respiratory: CTA no wheezes or ronchi  Abdomen: Soft NT ND NBSx 4 quadrants  Musculoskeletal: No LE edema. Edema noted on left hand  Skin:echymosis around left eye and left upper arm  Psychiatry: Mood appropriate for condition and setting   Data Reviewed: CBC: Recent Labs  Lab 09/25/17 0052 09/26/17 0517  WBC 5.4 4.8  NEUTROABS 3.8  --  HGB 8.8* 7.5*  HCT 25.0* 22.6*  MCV 87.7 90.0  PLT 106* 627*   Basic Metabolic Panel: Recent Labs  Lab 09/25/17 0052 09/26/17 0517  NA 127* 127*  K 3.9 3.8  CL 94* 93*  CO2 26 26  GLUCOSE 116* 118*  BUN 16 16  CREATININE 0.63 0.58  CALCIUM 8.9 8.3*   GFR: Estimated Creatinine Clearance: 39.3 mL/min (by C-G formula based on SCr of 0.58 mg/dL). Liver Function Tests: No results for input(s): AST, ALT, ALKPHOS, BILITOT, PROT, ALBUMIN in the last 168 hours. No results for input(s): LIPASE, AMYLASE in the  last 168 hours. No results for input(s): AMMONIA in the last 168 hours. Coagulation Profile: Recent Labs  Lab 09/25/17 0052 09/26/17 0517  INR 2.79 2.76   Cardiac Enzymes: Recent Labs  Lab 09/25/17 0052  TROPONINI 0.03*   BNP (last 3 results) No results for input(s): PROBNP in the last 8760 hours. HbA1C: No results for input(s): HGBA1C in the last 72 hours. CBG: No results for input(s): GLUCAP in the last 168 hours. Lipid Profile: No results for input(s): CHOL, HDL, LDLCALC, TRIG, CHOLHDL, LDLDIRECT in the last 72 hours. Thyroid Function Tests: No results for input(s): TSH, T4TOTAL, FREET4, T3FREE, THYROIDAB in the last 72 hours. Anemia Panel: No results for input(s): VITAMINB12, FOLATE, FERRITIN, TIBC, IRON, RETICCTPCT in the last 72 hours. Urine analysis:    Component Value Date/Time   COLORURINE AMBER (A) 09/25/2017 0215   APPEARANCEUR HAZY (A) 09/25/2017 0215   LABSPEC 1.024 09/25/2017 0215   PHURINE 5.0 09/25/2017 0215   GLUCOSEU NEGATIVE 09/25/2017 0215   HGBUR NEGATIVE 09/25/2017 0215   BILIRUBINUR NEGATIVE 09/25/2017 0215   KETONESUR NEGATIVE 09/25/2017 0215   PROTEINUR NEGATIVE 09/25/2017 0215   NITRITE NEGATIVE 09/25/2017 0215   LEUKOCYTESUR MODERATE (A) 09/25/2017 0215   Sepsis Labs: @LABRCNTIP (procalcitonin:4,lacticidven:4)  )No results found for this or any previous visit (from the past 240 hour(s)).    Studies: Ct Shoulder Left Wo Contrast  Result Date: 09/25/2017 CLINICAL DATA:  Proximal humerus fracture. EXAM: CT OF THE UPPER LEFT EXTREMITY WITHOUT CONTRAST TECHNIQUE: Multidetector CT imaging of the upper left extremity was performed according to the standard protocol. COMPARISON:  Left humerus x-rays from yesterday. FINDINGS: Bones/Joint/Cartilage Complex fracture of the proximal humerus. There is a comminuted, predominantly transverse fracture through the surgical neck with anterior and medial displacement of the distal humerus by 1 bone shaft  width. There is also a minimally displaced vertical fracture through the greater tuberosity. The humeral head is abducted and internally rotated. Moderate glenohumeral joint effusion. Ligaments Suboptimally assessed by CT. Muscles and Tendons No focal abnormality. Soft tissues Cardiomegaly. Moderate left pleural effusion with evidence of interstitial and alveolar edema. Diffuse anasarca. IMPRESSION: 1. Complex, comminuted, displaced fracture of the proximal humerus involving the surgical neck and greater tuberosity as described above. 2. Cardiomegaly with moderate left pleural effusion and pulmonary interstitial and alveolar edema. Electronically Signed   By: Titus Dubin M.D.   On: 09/25/2017 16:09    Scheduled Meds: . furosemide  20 mg Oral QODAY  . furosemide  40 mg Oral QODAY  . metoprolol succinate  50 mg Oral BID    Continuous Infusions:   LOS: 1 day     Kayleen Memos, MD [facility] Pager [phone]  If 7PM-7AM, please contact night-coverage [email or web address] Password [email or web address] 09/26/2017, 8:22 AM

## 2017-09-26 NOTE — Progress Notes (Signed)
Have discussed blood transfusion with pt earlier today. Have read consent form to her. She understands she needs to get the blood transfusion and wants to do so, but wants to discuss with her son or niece. This RN has attempted to contact her niece that is local with no success, able to reach son, Shearon Stalls and discussed and read blood consent form to him over the telephone.  He agrees that she should get the transfusion but wanted to try to reach the "niece" and call back.  Son able to reach other family--pt's niece's husband (an EMT), son talked with him and calling his mom (pt) back to reassure her that getting the blood transfusion was the best option for her especially before getting surgery.  Will go see pt and get consent signed if pt agrees.   -------Dr Leanor Rubenstein call son to discuss plan of surgery for Monday, he request a message be left about the procedure if he is unable to answer the phone at the time of the call. Pt consents to discussing her care with her son, Shea Evans.

## 2017-09-26 NOTE — Progress Notes (Signed)
Pt wants Dr Marlou Sa to discuss surgery with her and to talk with her son about the surgery (noted in progress note about blood consent that son want's Dr Marlou Sa to call him about the surgery--not clear on the procedure son thought shoulder replacement to be done not closed reduction.   Dr Marlou Sa not on this evening, note placed in chart for Dr Marlou Sa to contact son about the surgery. Night RN to check OR list tonight when published and will inform pt and asked night RN to call son when she finds out time of surgery--leaving a message on his voice mail if he doesn't answer his phone.

## 2017-09-27 DIAGNOSIS — E871 Hypo-osmolality and hyponatremia: Secondary | ICD-10-CM

## 2017-09-27 DIAGNOSIS — E876 Hypokalemia: Secondary | ICD-10-CM

## 2017-09-27 LAB — BASIC METABOLIC PANEL
Anion gap: 7 (ref 5–15)
BUN: 10 mg/dL (ref 6–20)
CHLORIDE: 93 mmol/L — AB (ref 101–111)
CO2: 26 mmol/L (ref 22–32)
CREATININE: 0.46 mg/dL (ref 0.44–1.00)
Calcium: 8.5 mg/dL — ABNORMAL LOW (ref 8.9–10.3)
GFR calc non Af Amer: >60 mL/min (ref >60)
Glucose, Bld: 94 mg/dL (ref 65–99)
Potassium: 4.1 mmol/L (ref 3.5–5.1)
Sodium: 126 mmol/L — ABNORMAL LOW (ref 135–145)

## 2017-09-27 LAB — CBC
HCT: 31.5 % — ABNORMAL LOW (ref 36.0–46.0)
Hemoglobin: 10.7 g/dL — ABNORMAL LOW (ref 12.0–15.0)
MCH: 30.7 pg (ref 26.0–34.0)
MCHC: 34 g/dL (ref 30.0–36.0)
MCV: 90.3 fL (ref 78.0–100.0)
Platelets: 116 10*3/uL — ABNORMAL LOW (ref 150–400)
RBC: 3.49 MIL/uL — ABNORMAL LOW (ref 3.87–5.11)
RDW: 14.2 % (ref 11.5–15.5)
WBC: 4.6 10*3/uL (ref 4.0–10.5)

## 2017-09-27 LAB — PROTIME-INR
INR: 1.34
PROTHROMBIN TIME: 16.5 s — AB (ref 11.4–15.2)

## 2017-09-27 NOTE — Progress Notes (Signed)
Pt's son called to check on pt. Pt's son states he would like [place] to notify him of the surgery time when it is scheduled.

## 2017-09-27 NOTE — Progress Notes (Signed)
Pt stable Rad pulse 1/4 with sig left arm swelling - the swelling around the shoulder is less Plan surgery tomorrow closed vs open reduction fixation vs replacement hgb 10 and inr 1.3

## 2017-09-27 NOTE — Progress Notes (Signed)
RN paged [facility] concerning BP. Nursing will continue to monitor.

## 2017-09-27 NOTE — Progress Notes (Addendum)
PROGRESS NOTE  Ladaysha Soutar GUY:[id number] DOB: 09-28-1924 DOA: 09/24/2017 PCP: Patient, No Pcp Per  HPI/Recap of past 24 hours: Ms. Oliveria is a 83 yo CF with PMH chronic a-fib on coumadin, who had a mechanical fall on Thursday 3 days ago presented to her PCP who did xray revealing left surgical neck fracture. Patient presented to [facility] on 09/24/17 evening and was admitted on 09/25/17 for left humerus fracture needing repair. Ortho consulted however, due to elevated INR on coumadin, surgery was postponed to Monday 09/27/17 or Tuesday.  No acute events overnight. Continue to hold coumadin. Goal INR 1.5 for surgery. INR 1.3 today. Planned surgery tomorrow 09/28/17 for left humerus repair.  Pt seen and examined at her bedside. Left arm appears edematous with reduced radial pulse 1/4. States it hurts when she moves. Alert and oriented x 3.  Assessment/Plan: Principal Problem:   Closed fracture of head of left humerus Active Problems:   A-fib [facility])  Acute Left surgical neck humerus fracture, pOA -ortho surgery following -INR 1.3 -Surgery possible Tuesday 09/28/17 -continue to hold coumadin -repeat INR am -pain management  Hyponatremia -unclear if chronic or acute -no prior records to compare -Na+ 126 from 127 yesterday -possibly 2/2 to intractable pain with left upper arm fracture for 3 days with no treatment vs chronic use of diuretic -fluid restriction -BMP am  Normocytic anemia with unclear chronicity -clinically undetermined -no prior records to compare -Hg 10.5 from 7.5 post 2 U PRBCs -CBC am  Thrombocytopenia with unclear chronicity -no previous lab to compare -plt 109 from 106 -no sign of overt bleeding -cbc am  Chronic a-fib -stable -rate controlled -continue metoprolol -hold coumadin -inr 1.3 -INR am  Chronic lower extremity edema -stable -lasix po 20-40 mg every other day   Code Status: Full  Family Communication: No family member at bedside.    Disposition Plan: Will stay another midnight with possible surgery on Tuesday 09/28/17. Goal INR for surgery is 1.5. INR 1.3 (09/27/17)  Consultants:  Orthopedic surgery  Procedures:  Planned left surgical neck humerus fracture repair 09/28/17  Antimicrobials:  None indicated  DVT prophylaxis:  SCDs. Planned surgery tomorrow   Objective: Vitals:   09/26/17 2207 09/27/17 0015 09/27/17 0422 09/27/17 1500  BP: (!) 148/73 (!) 168/67 (!) 151/62 (!) 144/61  Pulse: 74 88 86 83  Resp: 16 16 16 16   Temp: 98.4 F (36.9 C) 97.8 F (36.6 C) 98.9 F (37.2 C) 97.7 F (36.5 C)  TempSrc: Oral Oral Oral Oral  SpO2: 96% 96% 98% 94%  Weight:      Height:        Intake/Output Summary (Last 24 hours) at 09/27/2017 1540 Last data filed at 09/26/2017 2141 Gross per 24 hour  Intake 485 ml  Output -  Net 485 ml   Filed Weights   09/24/17 1947  Weight: 54.4 kg (120 lb)    Exam:   General:  83 yo CF thin built laying in bed with left arm sling in NAD. A&O x 3  Cardiovascular: IRRR with no rubs or gallops  Respiratory: CTA no wheezes or ronchi  Abdomen: Soft NT ND NBSx 4 quadrants  Musculoskeletal: No LE edema. Edema noted on left hand and arm with bruising radial pulse left 1/4  Skin:echymosis around left eye and left upper arm  Psychiatry: Mood appropriate for condition and setting   Data Reviewed: CBC: Recent Labs  Lab 09/25/17 0052 09/26/17 0517 09/27/17 0442  WBC 5.4 4.8 4.6  NEUTROABS 3.8  --   --  HGB 8.8* 7.5* 10.7*  HCT 25.0* 22.6* 31.5*  MCV 87.7 90.0 90.3  PLT 106* 109* 591*   Basic Metabolic Panel: Recent Labs  Lab 09/25/17 0052 09/26/17 0517 09/27/17 0442  NA 127* 127* 126*  K 3.9 3.8 4.1  CL 94* 93* 93*  CO2 26 26 26   GLUCOSE 116* 118* 94  BUN 16 16 10   CREATININE 0.63 0.58 0.46  CALCIUM 8.9 8.3* 8.5*   GFR: Estimated Creatinine Clearance: 39.3 mL/min (by C-G formula based on SCr of 0.46 mg/dL). Liver Function Tests: No results for  input(s): AST, ALT, ALKPHOS, BILITOT, PROT, ALBUMIN in the last 168 hours. No results for input(s): LIPASE, AMYLASE in the last 168 hours. No results for input(s): AMMONIA in the last 168 hours. Coagulation Profile: Recent Labs  Lab 09/25/17 0052 09/26/17 0517 09/27/17 0442  INR 2.79 2.76 1.34   Cardiac Enzymes: Recent Labs  Lab 09/25/17 0052  TROPONINI 0.03*   BNP (last 3 results) No results for input(s): PROBNP in the last 8760 hours. HbA1C: No results for input(s): HGBA1C in the last 72 hours. CBG: No results for input(s): GLUCAP in the last 168 hours. Lipid Profile: No results for input(s): CHOL, HDL, LDLCALC, TRIG, CHOLHDL, LDLDIRECT in the last 72 hours. Thyroid Function Tests: No results for input(s): TSH, T4TOTAL, FREET4, T3FREE, THYROIDAB in the last 72 hours. Anemia Panel: No results for input(s): VITAMINB12, FOLATE, FERRITIN, TIBC, IRON, RETICCTPCT in the last 72 hours. Urine analysis:    Component Value Date/Time   COLORURINE AMBER (A) 09/25/2017 0215   APPEARANCEUR HAZY (A) 09/25/2017 0215   LABSPEC 1.024 09/25/2017 0215   PHURINE 5.0 09/25/2017 0215   GLUCOSEU NEGATIVE 09/25/2017 0215   HGBUR NEGATIVE 09/25/2017 0215   BILIRUBINUR NEGATIVE 09/25/2017 0215   KETONESUR NEGATIVE 09/25/2017 0215   PROTEINUR NEGATIVE 09/25/2017 0215   NITRITE NEGATIVE 09/25/2017 0215   LEUKOCYTESUR MODERATE (A) 09/25/2017 0215   Sepsis Labs: @LABRCNTIP (procalcitonin:4,lacticidven:4)  )No results found for this or any previous visit (from the past 240 hour(s)).    Studies: No results found.  Scheduled Meds: . furosemide  20 mg Oral QODAY  . furosemide  40 mg Oral QODAY  . metoprolol succinate  50 mg Oral BID    Continuous Infusions:   LOS: 2 days     Kayleen Memos, MD [facility] Pager [phone]  If 7PM-7AM, please contact night-coverage [email or web address] Password [email or web address] 09/27/2017, 3:40 PM

## 2017-09-27 NOTE — Progress Notes (Signed)
MD states pt's surgery will be tomorrow instead of today and diet can be changed from NPO.

## 2017-09-28 ENCOUNTER — Inpatient Hospital Stay (HOSPITAL_COMMUNITY): Payer: Medicare Other

## 2017-09-28 ENCOUNTER — Inpatient Hospital Stay (HOSPITAL_COMMUNITY): Payer: Medicare Other | Admitting: Certified Registered"

## 2017-09-28 ENCOUNTER — Encounter (HOSPITAL_COMMUNITY): Payer: Self-pay | Admitting: Surgery

## 2017-09-28 ENCOUNTER — Encounter (HOSPITAL_COMMUNITY)
Admission: EM | Disposition: A | Discharge status: Skilled Nursing Facility | Payer: Self-pay | Source: Home / Self Care | Attending: Internal Medicine

## 2017-09-28 DIAGNOSIS — S42212A Unspecified displaced fracture of surgical neck of left humerus, initial encounter for closed fracture: Secondary | ICD-10-CM

## 2017-09-28 DIAGNOSIS — S4292XA Fracture of left shoulder girdle, part unspecified, initial encounter for closed fracture: Secondary | ICD-10-CM | POA: Diagnosis present

## 2017-09-28 HISTORY — PX: ORIF HUMERUS FRACTURE: SHX2126

## 2017-09-28 LAB — TYPE AND SCREEN
ABO/RH(D): O POS
Antibody Screen: NEGATIVE
UNIT DIVISION: 0
UNIT DIVISION: 0
Unit division: 0
Unit division: 0

## 2017-09-28 LAB — PROTIME-INR
INR: 1.28
Prothrombin Time: 15.9 seconds — ABNORMAL HIGH (ref 11.4–15.2)

## 2017-09-28 LAB — BPAM RBC
BLOOD PRODUCT EXPIRATION DATE: 201812252359
BLOOD PRODUCT EXPIRATION DATE: 201812252359
Blood Product Expiration Date: 201812252359
Blood Product Expiration Date: 201812252359
ISSUE DATE / TIME: 201812021815
ISSUE DATE / TIME: 201812022129
UNIT TYPE AND RH: 5100
UNIT TYPE AND RH: 5100
Unit Type and Rh: 5100
Unit Type and Rh: 5100

## 2017-09-28 LAB — SURGICAL PCR SCREEN
MRSA, PCR: POSITIVE — AB
STAPHYLOCOCCUS AUREUS: POSITIVE — AB

## 2017-09-28 SURGERY — OPEN REDUCTION INTERNAL FIXATION (ORIF) PROXIMAL HUMERUS FRACTURE
Anesthesia: General | Site: Shoulder | Laterality: Left

## 2017-09-28 MED ORDER — PHENOL 1.4 % MT LIQD
1.0000 | OROMUCOSAL | Status: DC | PRN
  Administered 2017-09-28: 1 via OROMUCOSAL

## 2017-09-28 MED ORDER — FENTANYL CITRATE (PF) 250 MCG/5ML IJ SOLN
INTRAMUSCULAR | Status: AC
  Filled 2017-09-28: qty 5

## 2017-09-28 MED ORDER — ONDANSETRON HCL 4 MG/2ML IJ SOLN: 4.0000 mg | Freq: Four times a day (QID) | INTRAMUSCULAR | Status: DC | PRN

## 2017-09-28 MED ORDER — FENTANYL CITRATE (PF) 250 MCG/5ML IJ SOLN
INTRAMUSCULAR | Status: DC | PRN
  Administered 2017-09-28: 50 ug via INTRAVENOUS

## 2017-09-28 MED ORDER — LACTATED RINGERS IV SOLN
INTRAVENOUS | Status: DC | PRN
  Administered 2017-09-28: 20:00:00 via INTRAVENOUS

## 2017-09-28 MED ORDER — CEFAZOLIN SODIUM-DEXTROSE 2-4 GM/100ML-% IV SOLN
INTRAVENOUS | Status: AC
  Filled 2017-09-28: qty 100

## 2017-09-28 MED ORDER — DEXAMETHASONE SODIUM PHOSPHATE 4 MG/ML IJ SOLN
INTRAMUSCULAR | Status: DC | PRN
  Administered 2017-09-28: 4 mg via INTRAVENOUS

## 2017-09-28 MED ORDER — LACTATED RINGERS IV SOLN: INTRAVENOUS | Status: DC

## 2017-09-28 MED ORDER — FENTANYL CITRATE (PF) 100 MCG/2ML IJ SOLN
25.0000 ug | INTRAMUSCULAR | Status: DC | PRN
Start: 2017-09-28 — End: 2017-09-28

## 2017-09-28 MED ORDER — WARFARIN - PHARMACIST DOSING INPATIENT
Freq: Every day | Status: DC
Start: 2017-09-28 — End: 2017-09-30

## 2017-09-28 MED ORDER — ACETAMINOPHEN 325 MG PO TABS: 650.0000 mg | ORAL_TABLET | ORAL | Status: DC | PRN

## 2017-09-28 MED ORDER — MUPIROCIN 2 % EX OINT
1.0000 "application " | TOPICAL_OINTMENT | Freq: Two times a day (BID) | CUTANEOUS | Status: DC
  Administered 2017-09-28 – 2017-09-30 (×5): 1 via NASAL
  Filled 2017-09-28 (×3): qty 22

## 2017-09-28 MED ORDER — PROPOFOL 10 MG/ML IV BOLUS
INTRAVENOUS | Status: DC | PRN
  Administered 2017-09-28: 80 mg via INTRAVENOUS

## 2017-09-28 MED ORDER — METOCLOPRAMIDE HCL 5 MG PO TABS: 5.0000 mg | ORAL_TABLET | Freq: Three times a day (TID) | ORAL | Status: DC | PRN

## 2017-09-28 MED ORDER — WARFARIN SODIUM 2 MG PO TABS: 2.0000 mg | ORAL_TABLET | Freq: Every day | ORAL | Status: DC

## 2017-09-28 MED ORDER — ONDANSETRON HCL 4 MG PO TABS: 4.0000 mg | ORAL_TABLET | Freq: Four times a day (QID) | ORAL | Status: DC | PRN

## 2017-09-28 MED ORDER — DEXTROSE-NACL 5-0.9 % IV SOLN
INTRAVENOUS | Status: AC
  Administered 2017-09-28: 10:00:00 via INTRAVENOUS

## 2017-09-28 MED ORDER — CHLORHEXIDINE GLUCONATE CLOTH 2 % EX PADS
6.0000 | MEDICATED_PAD | Freq: Every day | CUTANEOUS | Status: DC
  Administered 2017-09-28 – 2017-09-30 (×3): 6 via TOPICAL

## 2017-09-28 MED ORDER — SUCCINYLCHOLINE CHLORIDE 20 MG/ML IJ SOLN
INTRAMUSCULAR | Status: DC | PRN
  Administered 2017-09-28: 100 mg via INTRAVENOUS

## 2017-09-28 MED ORDER — PHENYLEPHRINE HCL 10 MG/ML IJ SOLN
INTRAMUSCULAR | Status: DC | PRN
  Administered 2017-09-28: 40 ug via INTRAVENOUS

## 2017-09-28 MED ORDER — ROPIVACAINE HCL 5 MG/ML IJ SOLN
INTRAMUSCULAR | Status: DC | PRN
  Administered 2017-09-28: 125 mg via PERINEURAL

## 2017-09-28 MED ORDER — WARFARIN SODIUM 3 MG PO TABS
3.0000 mg | ORAL_TABLET | Freq: Once | ORAL | Status: AC
  Administered 2017-09-28: 3 mg via ORAL
  Filled 2017-09-28 (×2): qty 1

## 2017-09-28 MED ORDER — ACETAMINOPHEN 650 MG RE SUPP: 650.0000 mg | RECTAL | Status: DC | PRN

## 2017-09-28 MED ORDER — MORPHINE SULFATE (PF) 2 MG/ML IV SOLN
2.0000 mg | INTRAVENOUS | Status: DC | PRN
  Administered 2017-09-28 (×3): 2 mg via INTRAVENOUS
  Administered 2017-09-29: 4 mg via INTRAVENOUS
  Administered 2017-09-29: 2 mg via INTRAVENOUS
  Filled 2017-09-28: qty 1
  Filled 2017-09-28: qty 2
  Filled 2017-09-28 (×3): qty 1

## 2017-09-28 MED ORDER — ONDANSETRON HCL 4 MG/2ML IJ SOLN
INTRAMUSCULAR | Status: DC | PRN
  Administered 2017-09-28: 4 mg via INTRAVENOUS

## 2017-09-28 MED ORDER — METOCLOPRAMIDE HCL 5 MG/ML IJ SOLN
5.0000 mg | Freq: Three times a day (TID) | INTRAMUSCULAR | Status: DC | PRN
  Administered 2017-09-29: 10 mg via INTRAVENOUS
  Filled 2017-09-28: qty 2

## 2017-09-28 MED ORDER — PROPOFOL 10 MG/ML IV BOLUS
INTRAVENOUS | Status: AC
  Filled 2017-09-28: qty 40

## 2017-09-28 SURGICAL SUPPLY — 47 items
BANDAGE ACE 4X5 VEL STRL LF (GAUZE/BANDAGES/DRESSINGS) IMPLANT
BANDAGE ACE 6X5 VEL STRL LF (GAUZE/BANDAGES/DRESSINGS) IMPLANT
BENZOIN TINCTURE PRP APPL 2/3 (GAUZE/BANDAGES/DRESSINGS) IMPLANT
BNDG COHESIVE 4X5 TAN STRL (GAUZE/BANDAGES/DRESSINGS) IMPLANT
COVER SURGICAL LIGHT HANDLE (MISCELLANEOUS) IMPLANT
DRAIN PENROSE 1/2X12 LTX STRL (WOUND CARE) IMPLANT
DRAPE C-ARM 42X72 X-RAY (DRAPES) IMPLANT
DRAPE IMP U-DRAPE 54X76 (DRAPES) IMPLANT
DRAPE U-SHAPE 47X51 STRL (DRAPES) IMPLANT
DRSG PAD ABDOMINAL 8X10 ST (GAUZE/BANDAGES/DRESSINGS) IMPLANT
DURAPREP 26ML APPLICATOR (WOUND CARE) IMPLANT
ELECT REM PT RETURN 9FT ADLT (ELECTROSURGICAL)
ELECTRODE REM PT RTRN 9FT ADLT (ELECTROSURGICAL) IMPLANT
FACESHIELD WRAPAROUND (MASK) IMPLANT
GAUZE SPONGE 4X4 12PLY STRL (GAUZE/BANDAGES/DRESSINGS) IMPLANT
GAUZE XEROFORM 5X9 LF (GAUZE/BANDAGES/DRESSINGS) IMPLANT
GLOVE BIOGEL PI IND STRL 8 (GLOVE) IMPLANT
GLOVE BIOGEL PI INDICATOR 8 (GLOVE)
GLOVE SURG ORTHO 8.0 STRL STRW (GLOVE) IMPLANT
GOWN STRL REUS W/ TWL LRG LVL3 (GOWN DISPOSABLE) IMPLANT
GOWN STRL REUS W/ TWL XL LVL3 (GOWN DISPOSABLE) IMPLANT
GOWN STRL REUS W/TWL LRG LVL3 (GOWN DISPOSABLE)
GOWN STRL REUS W/TWL XL LVL3 (GOWN DISPOSABLE)
KIT BASIN OR (CUSTOM PROCEDURE TRAY) IMPLANT
KIT ROOM TURNOVER OR (KITS) ×3 IMPLANT
MANIFOLD NEPTUNE II (INSTRUMENTS) IMPLANT
NEEDLE 21X1 OR PACK (NEEDLE) IMPLANT
NS IRRIG 1000ML POUR BTL (IV SOLUTION) IMPLANT
PACK SHOULDER (CUSTOM PROCEDURE TRAY) IMPLANT
PACK UNIVERSAL I (CUSTOM PROCEDURE TRAY) IMPLANT
PAD ARMBOARD 7.5X6 YLW CONV (MISCELLANEOUS) IMPLANT
PAD CAST 4YDX4 CTTN HI CHSV (CAST SUPPLIES) IMPLANT
PADDING CAST COTTON 4X4 STRL (CAST SUPPLIES)
PENCIL BUTTON HOLSTER BLD 10FT (ELECTRODE) IMPLANT
SLING ARM IMMOBILIZER LRG (SOFTGOODS) ×3 IMPLANT
SPONGE LAP 4X18 X RAY DECT (DISPOSABLE) IMPLANT
STAPLER VISISTAT 35W (STAPLE) IMPLANT
STOCKINETTE IMPERVIOUS 9X36 MD (GAUZE/BANDAGES/DRESSINGS) IMPLANT
SUCTION FRAZIER HANDLE 10FR (MISCELLANEOUS)
SUCTION TUBE FRAZIER 10FR DISP (MISCELLANEOUS) IMPLANT
SUT VIC AB 2-0 CTB1 (SUTURE) IMPLANT
TOWEL OR 17X24 6PK STRL BLUE (TOWEL DISPOSABLE) IMPLANT
TOWEL OR 17X26 10 PK STRL BLUE (TOWEL DISPOSABLE) IMPLANT
TUBE CONNECTING 12'X1/4 (SUCTIONS)
TUBE CONNECTING 12X1/4 (SUCTIONS) IMPLANT
WATER STERILE IRR 1000ML POUR (IV SOLUTION) IMPLANT
YANKAUER SUCT BULB TIP NO VENT (SUCTIONS) IMPLANT

## 2017-09-28 NOTE — Transfer of Care (Signed)
Immediate Anesthesia Transfer of Care Note  Patient: Judith Hunter  Procedure(s) Performed: CLOSED REDUCTION LEFT PROXIMAL HUMERUS FRACTURE (Left Shoulder)  Patient Location: PACU  Anesthesia Type:General  Level of Consciousness: awake  Airway & Oxygen Therapy: Patient Spontanous Breathing  Post-op Assessment: Report given to RN and Post -op Vital signs reviewed and stable  Post vital signs: Reviewed and stable  Last Vitals:  Vitals:   09/28/17 1345 09/28/17 2027  BP: (!) 148/71   Pulse: 80   Resp: 16   Temp: 36.8 C (!) (P) 36.4 C  SpO2: 96%     Last Pain:  Vitals:   09/28/17 2027  TempSrc:   PainSc: (P) 0-No pain      Patients Stated Pain Goal: 2 (66/59/93 5701)  Complications: No apparent anesthesia complications

## 2017-09-28 NOTE — Progress Notes (Signed)
PROGRESS NOTE  Judith Hunter:[id number] DOB: [birth year] DOA: 09/24/2017 PCP: Patient, No Pcp Per  Brief Recap: Judith Hunter is a 83 yo female with PMH chronic a-fib on coumadin, who had a mechanical fall 11/29 with resultant comminuted, markedly displaced left humeral head and neck fracture, presented to [facility] on 11/30, admitted on 12/1 for left humerus fracture needing repair. Ortho consulted however, due to elevated INR on coumadin, surgery was postponed. She was anemic, worsening to hgb 7.5 on 12/2, improved to 10.7 with transfusions. Coumadin was held and INR trended downward to 1.28. Plan is for fixation per Dr. Marlou Sa 12/4.   Assessment/Plan: Principal Problem:   Closed fracture of head of left humerus Active Problems:   A-fib (HCC)  Acute comminuted, displaced left humerus fracture: Following mechanical fall.  - Operative fixation (open vs. closed) today per Dr. Marlou Sa.  - Pain control as ordered  Hyponatremia: Appears chronic. Relatively stable.  - Will give isotonic IVFs while prolonged NPO.  - Monitor BMP  Normocytic anemia: With acute blood loss anemia component. Improved s/p 2u PRBCs.  - Recheck CBC in AM - Anemia panel in AM  Thrombocytopenia: Stable.  - Monitor  Chronic a-fib: Rate-controlled.  - Continue metoprolol - Holding coumadin, will restart based on orthopedics recommendations. Check INR in AM.   Chronic lower extremity edema:  - Continue lasix po 20-40 mg alternating every other day  Code Status: Full Family Communication: No family member at bedside.  Disposition Plan: Pending postoperative course  Consultants:  Orthopedic surgery, Dr. Marlou Sa  Procedures:  Planned left surgical neck humerus fracture repair 09/28/17  Antimicrobials:  None  DVT prophylaxis: SCDs. Restart anticoagulation per orthopedics  Subjective:  Left arm pain is stable, worse with any movement or touching. No bleeding. Bruising improved. Pt is hungry (NPO for  surgery).   Objective: Vitals:   09/27/17 2344 09/28/17 0733 09/28/17 1000 09/28/17 1345  BP: (!) 148/66 (!) 121/49 (!) 176/76 (!) 148/71  Pulse:  (!) 55 86 80  Resp:  16 18 16   Temp:  98.4 F (36.9 C)  98.3 F (36.8 C)  TempSrc:  Oral  Oral  SpO2:  97% 96% 96%  Weight:      Height:        Intake/Output Summary (Last 24 hours) at 09/28/2017 1649 Last data filed at 09/28/2017 1525 Gross per 24 hour  Intake 636.67 ml  Output 700 ml  Net -63.33 ml   Filed Weights   09/24/17 1947  Weight: 54.4 kg (120 lb)    Exam:   General:  Elderly pleasant female in no distress  Cardiovascular: Irreg irreg, no JVD   Respiratory: Nonlabored and clear  Abdomen: Soft NT ND +BS  Musculoskeletal: No LE edema. Left arm with proximal deformity, ecchymosis, soft compartment, +radial pulse, cap refill brisk.   Skin: Ecchymosis around left eye and left upper arm extending to torso.  Psychiatry: Judgment intact, mood appropriate, broad affect.   Data Reviewed: CBC: Recent Labs  Lab 09/25/17 0052 09/26/17 0517 09/27/17 0442  WBC 5.4 4.8 4.6  NEUTROABS 3.8  --   --   HGB 8.8* 7.5* 10.7*  HCT 25.0* 22.6* 31.5*  MCV 87.7 90.0 90.3  PLT 106* 109* 638*   Basic Metabolic Panel: Recent Labs  Lab 09/25/17 0052 09/26/17 0517 09/27/17 0442  NA 127* 127* 126*  K 3.9 3.8 4.1  CL 94* 93* 93*  CO2 26 26 26   GLUCOSE 116* 118* 94  BUN 16 16 10  CREATININE 0.63 0.58 0.46  CALCIUM 8.9 8.3* 8.5*   GFR: Estimated Creatinine Clearance: 39.3 mL/min (by C-G formula based on SCr of 0.46 mg/dL).  Coagulation Profile: Recent Labs  Lab 09/25/17 0052 09/26/17 0517 09/27/17 0442 09/28/17 0538  INR 2.79 2.76 1.34 1.28   Cardiac Enzymes: Recent Labs  Lab 09/25/17 0052  TROPONINI 0.03*   Urine analysis:    Component Value Date/Time   COLORURINE AMBER (A) 09/25/2017 0215   APPEARANCEUR HAZY (A) 09/25/2017 0215   LABSPEC 1.024 09/25/2017 0215   PHURINE 5.0 09/25/2017 0215   GLUCOSEU  NEGATIVE 09/25/2017 0215   HGBUR NEGATIVE 09/25/2017 0215   BILIRUBINUR NEGATIVE 09/25/2017 0215   KETONESUR NEGATIVE 09/25/2017 0215   PROTEINUR NEGATIVE 09/25/2017 0215   NITRITE NEGATIVE 09/25/2017 0215   LEUKOCYTESUR MODERATE (A) 09/25/2017 0215    Recent Results (from the past 240 hour(s))  Surgical pcr screen     Status: Abnormal   Collection Time: 09/28/17 12:12 AM  Result Value Ref Range Status   MRSA, PCR POSITIVE (A) NEGATIVE Final    Comment: RESULT CALLED TO, READ BACK BY AND VERIFIED WITHKalman Drape RN [phone] 09/28/17 A BROWNING    Staphylococcus aureus POSITIVE (A) NEGATIVE Final    Comment: (NOTE) The Xpert SA Assay (FDA approved for NASAL specimens in patients 17 years of age and older), is one component of a comprehensive surveillance program. It is not intended to diagnose infection nor to guide or monitor treatment.       Studies: No results found.  Scheduled Meds: . Chlorhexidine Gluconate Cloth  6 each Topical Q0600  . furosemide  20 mg Oral QODAY  . furosemide  40 mg Oral QODAY  . metoprolol succinate  50 mg Oral BID  . mupirocin ointment  1 application Nasal BID    Continuous Infusions: . dextrose 5 % and 0.9% NaCl 100 mL/hr at 09/28/17 1015     LOS: 3 days     Vance Gather, MD [facility] Pager [phone]   If 7PM-7AM, please contact night-coverage [email or web address] Password [facility] 09/28/2017, 4:49 PM

## 2017-09-28 NOTE — Anesthesia Postprocedure Evaluation (Signed)
Anesthesia Post Note  Patient: Judith Hunter  Procedure(s) Performed: CLOSED REDUCTION LEFT PROXIMAL HUMERUS FRACTURE (Left Shoulder)     Patient location during evaluation: PACU Anesthesia Type: General Level of consciousness: sedated Pain management: pain level controlled Vital Signs Assessment: post-procedure vital signs reviewed and stable Respiratory status: spontaneous breathing and respiratory function stable Cardiovascular status: stable Postop Assessment: no apparent nausea or vomiting Anesthetic complications: no    Last Vitals:  Vitals:   09/28/17 2041 09/28/17 2046  BP: (!) 149/80 133/73  Pulse: 96 90  Resp: (!) 27 (!) 27  Temp:  (!) 36.4 C  SpO2: 95% 94%    Last Pain:  Vitals:   09/28/17 2046  TempSrc:   PainSc: 0-No pain                 [name] DANIEL

## 2017-09-28 NOTE — Brief Op Note (Signed)
09/28/2017  8:29 PM  PATIENT:  Judith Hunter  83 y.o. female  PRE-OPERATIVE DIAGNOSIS:  left proximal humerus fracture  POST-OPERATIVE DIAGNOSIS:  left proximal humerus fracture  PROCEDURE:  Procedure(s): CLOSED REDUCTION LEFT PROXIMAL HUMERUS FRACTURE  SURGEON:  Surgeon(s): Marlou Sa, Tonna Corner, MD  ASSISTANT: Laure Kidney rnfa  ANESTHESIA:   general  EBL: 0 ml    Total I/O In: 300 [I.V.:300] Out: 0   BLOOD ADMINISTERED: none  DRAINS: none   LOCAL MEDICATIONS USED:  none  SPECIMEN:  No Specimen  COUNTS:  YES  TOURNIQUET:  * No tourniquets in log *  DICTATION: .Other Dictation: Dictation Number [id number]  PLAN OF CARE: Admit to inpatient   PATIENT DISPOSITION:  PACU - hemodynamically stable

## 2017-09-28 NOTE — Anesthesia Procedure Notes (Signed)
Anesthesia Regional Block: Interscalene brachial plexus block   Pre-Anesthetic Checklist: ,, timeout performed, Correct Patient, Correct Site, Correct Laterality, Correct Procedure, Correct Position, site marked, Risks and benefits discussed,  Surgical consent,  Pre-op evaluation,  At surgeon's request and post-op pain management  Laterality: Left  Prep: chloraprep       Needles:  Injection technique: Single-shot  Needle Type: Echogenic Stimulator Needle     Needle Length: 5cm  Needle Gauge: 22     Additional Needles:   Narrative:  Start time: 09/28/2017 7:26 PM End time: 09/28/2017 7:36 PM Injection made incrementally with aspirations every 5 mL.  Performed by: Personally  Anesthesiologist: Duane Boston, MD  Additional Notes: Functioning IV was confirmed and monitors applied.  A 79mm 22ga echogenic arrow stimulator was used. Sterile prep and drape,hand hygiene and sterile gloves were used.Ultrasound guidance: relevant anatomy identified, needle position confirmed, local anesthetic spread visualized around nerve(s)., vascular puncture avoided.  Image printed for medical record.  Negative aspiration and negative test dose prior to incremental administration of local anesthetic. The patient tolerated the procedure well.

## 2017-09-28 NOTE — Progress Notes (Signed)
External catheter (purewick) was placed due to patient being in extreme pain when having to turn to use bedpan. Educated patient on use, and how this is a short term use until they are post op. Surgery is scheduled for 09/28/17 in the afternoon.

## 2017-09-28 NOTE — Progress Notes (Signed)
Plan surgery today. Discussed all options with her son [name] last night. All questions answered

## 2017-09-28 NOTE — Progress Notes (Signed)
ANTICOAGULATION CONSULT NOTE - Initial Consult  Pharmacy Consult for warfarin Indication: atrial fibrillation and VTE prophylaxis  Allergies  Allergen Reactions  . Azithromycin Other (See Comments)    unknown  . Ciprofloxacin     Felt like her head was on fire    Patient Measurements: Height: 5\' 6"  (167.6 cm) Weight: 120 lb (54.4 kg) IBW/kg (Calculated) : 59.3  Vital Signs: Temp: 97.5 F (36.4 C) (12/04 2046) Temp Source: Oral (12/04 1345) BP: 133/73 (12/04 2046) Pulse Rate: 90 (12/04 2046)  Labs: Recent Labs    09/26/17 0517 09/27/17 0442 09/28/17 0538  HGB 7.5* 10.7*  --   HCT 22.6* 31.5*  --   PLT 109* 116*  --   LABPROT 29.0* 16.5* 15.9*  INR 2.76 1.34 1.28  CREATININE 0.58 0.46  --     Estimated Creatinine Clearance: 39.3 mL/min (by C-G formula based on SCr of 0.46 mg/dL).   Medical History: Past Medical History:  Diagnosis Date  . A-fib ([place])   . Lymphoma ([place])    Remission for ~5 years now per patient (looks more like 11 years remission by [facility] onc notes)    Medications:  Medications Prior to Admission  Medication Sig Dispense Refill Last Dose  . COUMADIN 2 MG tablet Take 2 mg by mouth daily.   1 09/24/2017 at Unknown time  . furosemide (LASIX) 20 MG tablet Take 20-40 mg by mouth See admin instructions. Alternate 20 mg and 40 mg every other day   09/24/2017 at Unknown time  . TOPROL XL 100 MG 24 hr tablet Take 50 mg by mouth 2 (two) times daily.  1 09/24/2017 at Unknown time    Assessment: 83 y/o female on warfarin PTA who had a mechanical fall sustaining a humerus fracture. She is s/p closed reduction of left proximal humerus fracture. Pharmacy consulted to manage warfarin.  INR is subtherapeutic at 1.28 today. No bleeding noted, Hgb 10.7 s/p 2 units PRBC, platelets are low at 116.  PTA regimen: 2 mg daily  Goal of Therapy:  INR 2-3 Monitor platelets by anticoagulation protocol: Yes   Plan:  - Warfarin 3 mg PO tonight - Daily INR -  Monitor for s/sx of bleeding   Renold Genta, PharmD, [place] - [phone] 09/28/2017 9:00 PM

## 2017-09-28 NOTE — Anesthesia Procedure Notes (Signed)
Procedure Name: Intubation Date/Time: 09/28/2017 8:06 PM Performed by: Clovis Cao, CRNA Pre-anesthesia Checklist: Patient identified, Emergency Drugs available, Suction available, Patient being monitored and Timeout performed Patient Re-evaluated:Patient Re-evaluated prior to induction Oxygen Delivery Method: Circle system utilized Preoxygenation: Pre-oxygenation with 100% oxygen Induction Type: IV induction Ventilation: Mask ventilation without difficulty Laryngoscope Size: Miller and 2 Grade View: Grade I Tube type: Oral Tube size: 7.0 mm Number of attempts: 1 Airway Equipment and Method: Stylet Placement Confirmation: ETT inserted through vocal cords under direct vision,  positive ETCO2 and breath sounds checked- equal and bilateral Secured at: 21 cm Tube secured with: Tape Dental Injury: Teeth and Oropharynx as per pre-operative assessment

## 2017-09-28 NOTE — Anesthesia Preprocedure Evaluation (Addendum)
Anesthesia Evaluation  Patient identified by MRN, date of birth, ID band Patient awake    Reviewed: Allergy & Precautions, NPO status , Patient's Chart, lab work & pertinent test results  History of Anesthesia Complications Negative for: history of anesthetic complications  Airway Mallampati: II  TM Distance: >3 FB Neck ROM: Full    Dental no notable dental hx. (+) Dental Advisory Given   Pulmonary neg pulmonary ROS,    Pulmonary exam normal        Cardiovascular + dysrhythmias Atrial Fibrillation  Rhythm:Irregular Rate:Normal     Neuro/Psych negative neurological ROS  negative psych ROS   GI/Hepatic negative GI ROS, Neg liver ROS,   Endo/Other  negative endocrine ROS  Renal/GU negative Renal ROS  negative genitourinary   Musculoskeletal negative musculoskeletal ROS (+)   Abdominal   Peds negative pediatric ROS (+)  Hematology negative hematology ROS (+)   Anesthesia Other Findings   Reproductive/Obstetrics negative OB ROS                             Anesthesia Physical Anesthesia Plan  ASA: III  Anesthesia Plan: General   Post-op Pain Management: GA combined w/ Regional for post-op pain   Induction: Intravenous  PONV Risk Score and Plan: 3 and Ondansetron, Dexamethasone and Diphenhydramine  Airway Management Planned: Oral ETT  Additional Equipment:   Intra-op Plan:   Post-operative Plan: Extubation in OR  Informed Consent: I have reviewed the patients History and Physical, chart, labs and discussed the procedure including the risks, benefits and alternatives for the proposed anesthesia with the patient or authorized representative who has indicated his/her understanding and acceptance.   Dental advisory given  Plan Discussed with: CRNA and Anesthesiologist  Anesthesia Plan Comments:         Anesthesia Quick Evaluation

## 2017-09-28 NOTE — Progress Notes (Signed)
Assessed redness of the bilateral heels, blanchable. Floated heels on pillow to prevent direct pressure. Noted in assessment

## 2017-09-29 ENCOUNTER — Encounter (HOSPITAL_COMMUNITY): Payer: Self-pay | Admitting: Orthopedic Surgery

## 2017-09-29 DIAGNOSIS — I482 Chronic atrial fibrillation: Secondary | ICD-10-CM

## 2017-09-29 DIAGNOSIS — R52 Pain, unspecified: Secondary | ICD-10-CM

## 2017-09-29 DIAGNOSIS — S42292A Other displaced fracture of upper end of left humerus, initial encounter for closed fracture: Principal | ICD-10-CM

## 2017-09-29 DIAGNOSIS — S4292XD Fracture of left shoulder girdle, part unspecified, subsequent encounter for fracture with routine healing: Secondary | ICD-10-CM

## 2017-09-29 DIAGNOSIS — S42292K Other displaced fracture of upper end of left humerus, subsequent encounter for fracture with nonunion: Secondary | ICD-10-CM

## 2017-09-29 LAB — BASIC METABOLIC PANEL
ANION GAP: 8 (ref 5–15)
BUN: 6 mg/dL (ref 6–20)
CHLORIDE: 94 mmol/L — AB (ref 101–111)
CO2: 27 mmol/L (ref 22–32)
CREATININE: 0.37 mg/dL — AB (ref 0.44–1.00)
Calcium: 8.4 mg/dL — ABNORMAL LOW (ref 8.9–10.3)
GFR calc non Af Amer: >60 mL/min (ref >60)
Glucose, Bld: 101 mg/dL — ABNORMAL HIGH (ref 65–99)
Potassium: 3.7 mmol/L (ref 3.5–5.1)
SODIUM: 129 mmol/L — AB (ref 135–145)

## 2017-09-29 LAB — PROTIME-INR
INR: 1.49
PROTHROMBIN TIME: 17.9 s — AB (ref 11.4–15.2)

## 2017-09-29 LAB — FERRITIN: FERRITIN: 113 ng/mL (ref 11–307)

## 2017-09-29 LAB — CBC
HEMATOCRIT: 32 % — AB (ref 36.0–46.0)
HEMOGLOBIN: 10.8 g/dL — AB (ref 12.0–15.0)
MCH: 30.7 pg (ref 26.0–34.0)
MCHC: 33.8 g/dL (ref 30.0–36.0)
MCV: 90.9 fL (ref 78.0–100.0)
Platelets: 118 10*3/uL — ABNORMAL LOW (ref 150–400)
RBC: 3.52 MIL/uL — AB (ref 3.87–5.11)
RDW: 14.7 % (ref 11.5–15.5)
WBC: 4.2 10*3/uL (ref 4.0–10.5)

## 2017-09-29 LAB — IRON AND TIBC
Iron: 72 ug/dL (ref 28–170)
Saturation Ratios: 17 % (ref 10.4–31.8)
TIBC: 417 ug/dL (ref 250–450)
UIBC: 345 ug/dL

## 2017-09-29 LAB — FOLATE: FOLATE: 9.5 ng/mL (ref >5.9)

## 2017-09-29 LAB — VITAMIN B12: VITAMIN B 12: 428 pg/mL (ref 180–914)

## 2017-09-29 MED ORDER — WARFARIN SODIUM 2 MG PO TABS
2.0000 mg | ORAL_TABLET | Freq: Once | ORAL | Status: AC
  Administered 2017-09-29: 2 mg via ORAL
  Filled 2017-09-29: qty 1

## 2017-09-29 NOTE — Progress Notes (Signed)
Arm swelling has gone down with interventions. Edema +1, cap refill less than 3, full sensation in hand. Sling in place, fresh ice applied, arm elevated. Patient alert and oriented. Will continue to monitor

## 2017-09-29 NOTE — Progress Notes (Signed)
PT Cancellation Note  Patient Details Name: Judith Hunter MRN: [id number] DOB: 1924-01-02   Cancelled Treatment:   Attempted to evaluate patient this morning, patient has just returned to bed and is very fatigued, 7/10 pain, requested that PT return in afternoon for evaluation.  Deniece Ree PT, DPT, CBIS  Supplemental Physical Therapist [facility]

## 2017-09-29 NOTE — Evaluation (Signed)
Physical Therapy Evaluation Patient Details Name: Judith Hunter MRN: [id number] DOB: 06-Oct-1924 Today's Date: 09/29/2017   History of Present Illness  83 yo female with recent fall leading to L humerus fracture, surgically addresed with closed reduction; hx of A-fib rate controlled  Clinical Impression  Patient received supine in bed, pleasant and willing to participate in skilled PT evaluation this afternoon; note patient very talkative and requires extended time for task performance with PT today. Functional bed mobility generally performed with Min assist, able to transfer to and from bedside commode with Min assist as well today but noted considerable unsteadiness and balance difficulty while standing with patient attempting to clean herself in front of commode today. Patient returned to bed at end of session, left with [facility] elevated and nurse tech attending to patient, all needs otherwise met. Recommend SNF due to considerable difficulty with functional mobility, gait, and inability to maintain functional independence as compared to her baseline level of function.    Follow Up Recommendations SNF    Equipment Recommendations  None recommended by PT    Recommendations for Other Services       Precautions / Restrictions Precautions Precautions: Fall Restrictions Weight Bearing Restrictions: Yes LUE Weight Bearing: Weight bear through elbow only      Mobility  Bed Mobility Overal bed mobility: Needs Assistance Bed Mobility: Supine to Sit;Sit to Supine     Supine to sit: Min assist Sit to supine: Min guard      Transfers Overall transfer level: Needs assistance Equipment used: 1 person hand held assist Transfers: Sit to/from [facility] Sit to Stand: Min assist Stand pivot transfers: Min assist          Ambulation/Gait             General Gait Details: DNT today due to fatigue and safety concerns   Stairs            Wheelchair  Mobility    Modified Rankin (Stroke Patients Only)       Balance Overall balance assessment: Needs assistance Sitting-balance support: Feet supported;Single extremity supported Sitting balance-Leahy Scale: Good     Standing balance support: No upper extremity supported Standing balance-Leahy Scale: Fair                               Pertinent Vitals/Pain Pain Assessment: 0-10 Pain Score: 6  Pain Location: L UE  Pain Descriptors / Indicators: Sharp Pain Intervention(s): Limited activity within patient's tolerance;Monitored during session    Home Living Family/patient expects to be discharged to:: Private residence Living Arrangements: Alone Available Help at Discharge: Family Type of Home: House Home Access: Stairs to enter Entrance Stairs-Rails: None Technical brewer of Steps: 1 Home Layout: Two level Home Equipment: None      Prior Function Level of Independence: Independent               Hand Dominance   Dominant Hand: Right    Extremity/Trunk Assessment   Upper Extremity Assessment Upper Extremity Assessment: Defer to OT evaluation    Lower Extremity Assessment Lower Extremity Assessment: Generalized weakness    Cervical / Trunk Assessment Cervical / Trunk Assessment: Kyphotic  Communication   Communication: HOH  Cognition Arousal/Alertness: Awake/alert Behavior During Therapy: WFL for tasks assessed/performed Overall Cognitive Status: Within Functional Limits for tasks assessed  General Comments      Exercises     Assessment/Plan    PT Assessment Patient needs continued PT services  PT Problem List Decreased strength;Decreased mobility;Decreased safety awareness;Decreased coordination;Decreased balance       PT Treatment Interventions DME instruction;Therapeutic activities;Gait training;Therapeutic exercise;Stair training;Balance training;Neuromuscular  re-education;Functional mobility training;Patient/family education    PT Goals (Current goals can be found in the Care Plan section)  Acute Rehab PT Goals Patient Stated Goal: to get stronger  PT Goal Formulation: With patient Time For Goal Achievement: 10/13/17 Potential to Achieve Goals: Good    Frequency Min 2X/week   Barriers to discharge        Co-evaluation               AM-PAC PT "6 Clicks" Daily Activity  Outcome Measure Difficulty turning over in bed (including adjusting bedclothes, sheets and blankets)?: Unable Difficulty moving from lying on back to sitting on the side of the bed? : Unable Difficulty sitting down on and standing up from a chair with arms (e.g., wheelchair, bedside commode, etc,.)?: Unable Help needed moving to and from a bed to chair (including a wheelchair)?: A Little Help needed walking in hospital room?: A Lot Help needed climbing 3-5 steps with a railing? : Total 6 Click Score: 9    End of Session Equipment Utilized During Treatment: Other (comment)(L UE sling ) Activity Tolerance: Patient tolerated treatment well;Patient limited by fatigue Patient left: in bed;with nursing/sitter in room;with call bell/phone within reach;Other (comment)(nurse tech attending to patient )   PT Visit Diagnosis: Unsteadiness on feet (R26.81);History of falling (Z91.81);Muscle weakness (generalized) (M62.81)    Time: 8295-6213 PT Time Calculation (min) (ACUTE ONLY): 50 min   Charges:   PT Evaluation $PT Eval Low Complexity: 1 Low PT Treatments $Therapeutic Activity: 23-37 mins   PT G Codes:   PT G-Codes **NOT FOR INPATIENT CLASS** Functional Assessment Tool Used: AM-PAC 6 Clicks Basic Mobility;Clinical judgement Functional Limitation: Mobility: Walking and moving around Mobility: Walking and Moving Around Current Status (Y8657): At least 60 percent but less than 80 percent impaired, limited or restricted Mobility: Walking and Moving Around Goal Status  [phone]): At least 40 percent but less than 60 percent impaired, limited or restricted     Deniece Ree PT, DPT, CBIS  Supplemental Physical Therapist [facility]

## 2017-09-29 NOTE — Discharge Instructions (Signed)

## 2017-09-29 NOTE — Progress Notes (Signed)
Dr. Marlou Sa was contacted about left arm and hand swelling. Arm is elevated, sling on, ice applied. Pulses strong, +2 edema, capillary refill less than 3 seconds. Dr. Marlou Sa ordered for OT eval, and for a circulation arm band to be placed. OT contacted by RN, message was left. Will continue to monitor.

## 2017-09-29 NOTE — Progress Notes (Signed)
[facility]                                                                              Patient Demographics  Emunah Texidor, is a 83 y.o. female, DOB - 1924-03-16, EPP:[id number]  Admit date - 09/24/2017   Admitting Physician Etta Quill, DO  Outpatient Primary MD for the patient is Patient, No Pcp Per  Outpatient specialists:   LOS - 4  days   Medical records reviewed and are as summarized below:    Chief Complaint  Patient presents with  . Shoulder Injury       Brief summary   Ms. Riederer is a 83 yo female with PMH chronic a-fib on coumadin, who had a mechanical fall 11/29 with resultant comminuted, markedly displaced left humeral head and neck fracture, presented to [facility] on 11/[facility], admitted on 12/1 for left humerus fracture needing repair. Ortho consulted however, due to elevated INR on coumadin, surgery was postponed. She was anemic, worsening to hgb 7.5 on 12/2, improved to 10.7 with transfusions.  Assessment & Plan    Principal Problem: Acute comminuted displaced closed fracture of head of left humerus -Status post closed reduction left proximal humerus fracture, postop day #1 -Currently stable, pain controlled -Start PT OT today  Active Problems:   A-fib ([place]), chronic -Continue metoprolol -Restarted Coumadin per pharmacy after OR  Normocytic anemia with acute blood loss with surgery -Improved after 2 units packed RBCs, currently stable  Hyponatremia, chronic -Appears to be stable, improving  Thrombocytopenia -Stable  Chronic lower extremity edema -Continue Lasix 20 mg, 40 mg alternating  Code Status: Full code DVT Prophylaxis: Coumadin Family Communication: Discussed in detail with the patient, all imaging results, lab results explained to the patient   Disposition Plan: Start PT today, will likely need skilled nursing facility  Time Spent in minutes   35 minutes  Procedures:  Closed reduction of left proximal  humerus  Consultants:   Orthopedics  Antimicrobials:      Medications  Scheduled Meds: . Chlorhexidine Gluconate Cloth  6 each Topical Q0600  . furosemide  20 mg Oral QODAY  . furosemide  40 mg Oral QODAY  . metoprolol succinate  50 mg Oral BID  . mupirocin ointment  1 application Nasal BID  . warfarin  2 mg Oral ONCE-1800  . Warfarin - Pharmacist Dosing Inpatient   Does not apply q1800   Continuous Infusions: . lactated ringers 10 mL/hr at 09/28/17 2107   PRN Meds:.acetaminophen **OR** acetaminophen, acetaminophen **OR** acetaminophen, HYDROcodone-acetaminophen, metoCLOPramide **OR** metoCLOPramide (REGLAN) injection, morphine injection, ondansetron **OR** ondansetron (ZOFRAN) IV, ondansetron **OR** ondansetron (ZOFRAN) IV, phenol   Antibiotics   Anti-infectives (From admission, onward)   Start     Dose/Rate Route Frequency Ordered Stop   09/28/17 1911  ceFAZolin (ANCEF) 2-4 GM/100ML-% IVPB    Comments:  Laurita Quint   : cabinet override      09/28/17 1911 09/28/17 2327        Subjective:   Shaquela Weichert was seen and examined today.  Denies any specific complaints, pain controlled.  4/10. Patient denies dizziness, chest pain, shortness of breath, abdominal pain,  N/V/D/C, new weakness, numbess, tingling. No acute events overnight.    Objective:   Vitals:   09/28/17 2041 09/28/17 2046 09/28/17 2100 09/29/17 0500  BP: (!) 149/80 133/73 137/82 (!) 146/68  Pulse: 96 90 90 80  Resp: (!) 27 (!) 27 20 18   Temp:  (!) 97.5 F (36.4 C) 97.7 F (36.5 C) 97.7 F (36.5 C)  TempSrc:   Oral Oral  SpO2: 95% 94% 91% 95%  Weight:      Height:        Intake/Output Summary (Last 24 hours) at 09/29/2017 1058 Last data filed at 09/29/2017 1015 Gross per 24 hour  Intake 1175.5 ml  Output 1150 ml  Net 25.5 ml     Wt Readings from Last 3 Encounters:  09/24/17 54.4 kg (120 lb)     Exam  General: Alert and oriented x 3, NAD  Eyes:   HEENT:    Cardiovascular: S1  S2 auscultated, no rubs, murmurs or gallops. Regular rate and rhythm.  Respiratory: Clear to auscultation bilaterally, no wheezing, rales or rhonchi  Gastrointestinal: Soft, nontender, nondistended, + bowel sounds  Ext: no pedal edema bilaterally, left arm in sling  Neuro: no new deficit  Musculoskeletal: No digital cyanosis, clubbing  Skin: No rashes  Psych: Normal affect and demeanor, alert and oriented x3    Data Reviewed:  I have personally reviewed following labs and imaging studies  Micro Results Recent Results (from the past 240 hour(s))  Surgical pcr screen     Status: Abnormal   Collection Time: 09/28/17 12:12 AM  Result Value Ref Range Status   MRSA, PCR POSITIVE (A) NEGATIVE Final    Comment: RESULT CALLED TO, READ BACK BY AND VERIFIED WITHKalman Drape RN [phone] 09/28/17 A BROWNING    Staphylococcus aureus POSITIVE (A) NEGATIVE Final    Comment: (NOTE) The Xpert SA Assay (FDA approved for NASAL specimens in patients 23 years of age and older), is one component of a comprehensive surveillance program. It is not intended to diagnose infection nor to guide or monitor treatment.     Radiology Reports Dg Chest 2 View  Result Date: 09/25/2017 CLINICAL DATA:  Preop left humerus fracture. EXAM: CHEST  2 VIEW COMPARISON:  Chest radiograph yesterday FINDINGS: Stable cardiomegaly. Unchanged diffuse coarse interstitial opacities, probable peripheral scarring in the upper lungs. Possible small pleural effusions. No pneumothorax. Displaced left proximal humerus fracture. Bones are under mineralized. Compression fracture in the lower thoracic spine, age indeterminate. IMPRESSION: 1. Cardiomegaly. Coarse interstitial opacities may be chronic bronchitic change or pulmonary edema. Small pleural effusions. Findings are unchanged from radiograph yesterday. 2. Displaced left proximal humerus fracture. Compression deformity in the lower thoracic spine, age indeterminate. Electronically Signed    By: Jeb Levering M.D.   On: 09/25/2017 00:42   Ct Head Wo Contrast  Result Date: 09/24/2017 CLINICAL DATA:  83 y/o F; fall with head injury and bruising to the left eye. EXAM: CT HEAD WITHOUT CONTRAST CT CERVICAL SPINE WITHOUT CONTRAST TECHNIQUE: Multidetector CT imaging of the head and cervical spine was performed following the standard protocol without intravenous contrast. Multiplanar CT image reconstructions of the cervical spine were also generated. COMPARISON:  None. FINDINGS: CT HEAD FINDINGS Brain: No evidence of acute infarction, hemorrhage, hydrocephalus, extra-axial collection or mass lesion/mass effect. Mild chronic microvascular ischemic changes and parenchymal volume loss of the brain. Vascular: Calcific atherosclerosis of carotid siphons and vertebrobasilar system. Skull: Normal. Negative for fracture or focal lesion. Sinuses/Orbits: No acute finding. Other: Bilateral TMJ  osteoarthritis with flattening of the mandibular condyles, loss of joint space, and mineralization in the left TMJ joint space. CT CERVICAL SPINE FINDINGS Alignment: Grade 1 C4-5, C7-T1, and T1-2 anterolisthesis. Straightening of cervical lordosis. Skull base and vertebrae: No acute fracture. No primary bone lesion or focal pathologic process. Soft tissues and spinal canal: No prevertebral fluid or swelling. No visible canal hematoma. Disc levels: Moderate discogenic degenerative changes at the C5-C7 levels with loss of disc space height and endplate sclerosis. Prominent upper cervical facet arthrosis. Uncovertebral and facet hypertrophy encroaches on the neural foramen bilaterally greatest at the C5-6 and C6-7 levels. Upper chest: Small left pleural effusion. Other: Calcific atherosclerosis of carotid siphons. Edema and left supraclavicular fossa and axilla. IMPRESSION: CT head: 1. No acute intracranial abnormality or calvarial fracture. 2. Mild chronic microvascular ischemic changes and parenchymal volume loss of the  brain. 3. TMJ osteoarthrosis. CT cervical spine: 1. No acute fracture or dislocation identified. 2. Moderate cervical spondylosis greatest at C5-C7 levels. 3. Small left pleural effusion and edema within the left supraclavicular fossa and axilla partially visualized. Electronically Signed   By: Kristine Garbe M.D.   On: 09/24/2017 20:50   Ct Cervical Spine Wo Contrast  Result Date: 09/24/2017 CLINICAL DATA:  83 y/o F; fall with head injury and bruising to the left eye. EXAM: CT HEAD WITHOUT CONTRAST CT CERVICAL SPINE WITHOUT CONTRAST TECHNIQUE: Multidetector CT imaging of the head and cervical spine was performed following the standard protocol without intravenous contrast. Multiplanar CT image reconstructions of the cervical spine were also generated. COMPARISON:  None. FINDINGS: CT HEAD FINDINGS Brain: No evidence of acute infarction, hemorrhage, hydrocephalus, extra-axial collection or mass lesion/mass effect. Mild chronic microvascular ischemic changes and parenchymal volume loss of the brain. Vascular: Calcific atherosclerosis of carotid siphons and vertebrobasilar system. Skull: Normal. Negative for fracture or focal lesion. Sinuses/Orbits: No acute finding. 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 CT CERVICAL SPINE FINDINGS Alignment: Grade 1 C4-5, C7-T1, and T1-2 anterolisthesis. Straightening of cervical lordosis. Skull base and vertebrae: No acute fracture. No primary bone lesion or focal pathologic process. Soft tissues and spinal canal: No prevertebral fluid or swelling. No visible canal hematoma. Disc levels: Moderate discogenic degenerative changes at the C5-C7 levels with loss of disc space height and endplate sclerosis. Prominent upper cervical facet arthrosis. Uncovertebral and facet hypertrophy encroaches on the neural foramen bilaterally greatest at the C5-6 and C6-7 levels.  Upper chest: Small left pleural effusion. Other: Calcific atherosclerosis of carotid siphons. Edema and left supraclavicular fossa and axilla. IMPRESSION: CT head: 1. No acute intracranial abnormality or calvarial fracture. 2. Mild chronic microvascular ischemic changes and parenchymal volume loss of the brain. 3. TMJ osteoarthrosis. CT cervical spine: 1. No acute fracture or dislocation identified. 2. Moderate cervical spondylosis greatest at C5-C7 levels. 3. Small left pleural effusion and edema within the left supraclavicular fossa and axilla partially visualized. Electronically Signed   By: Kristine Garbe M.D.   On: 09/24/2017 20:50   Ct Shoulder Left Wo Contrast  Result Date: 09/25/2017 CLINICAL DATA:  Proximal humerus fracture. EXAM: CT OF THE UPPER LEFT EXTREMITY WITHOUT CONTRAST TECHNIQUE: Multidetector CT imaging of the upper left extremity was performed according to the standard protocol. COMPARISON:  Left humerus x-rays from yesterday. FINDINGS: Bones/Joint/Cartilage Complex fracture of the proximal humerus. There is a comminuted, predominantly transverse fracture through the surgical neck with anterior and medial displacement of the distal  humerus by 1 bone shaft width. There is also a minimally displaced vertical fracture through the greater tuberosity. The humeral head is abducted and internally rotated. Moderate glenohumeral joint effusion. Ligaments Suboptimally assessed by CT. Muscles and Tendons No focal abnormality. Soft tissues Cardiomegaly. Moderate left pleural effusion with evidence of interstitial and alveolar edema. Diffuse anasarca. IMPRESSION: 1. Complex, comminuted, displaced fracture of the proximal humerus involving the surgical neck and greater tuberosity as described above. 2. Cardiomegaly with moderate left pleural effusion and pulmonary interstitial and alveolar edema. Electronically Signed   By: Titus Dubin M.D.   On: 09/25/2017 16:09   Dg Pelvis  Portable  Result Date: 09/29/2017 CLINICAL DATA:  This post fall. EXAM: PORTABLE PELVIS 1-2 VIEWS COMPARISON:  None. FINDINGS: There is no evidence of pelvic fracture or diastasis. No pelvic bone lesions are seen. IMPRESSION: Negative. Electronically Signed   By: Fidela Salisbury M.D.   On: 09/29/2017 03:22   Dg Shoulder Left  Result Date: 09/28/2017 CLINICAL DATA:  Postreduction LEFT shoulder fracture. EXAM: LEFT SHOULDER - 2+ VIEW; DG C-ARM 61-120 MIN FLUOROSCOPY TIME:  8 seconds COMPARISON:  CT LEFT shoulder to September 25, 2017. FINDINGS: Two procedure fluoroscopic spot views LEFT shoulder. Interpreting radiologist was not present at time of procedure. LEFT humeral head fracture. IMPRESSION: LEFT humeral head fracture. Electronically Signed   By: Elon Alas M.D.   On: 09/28/2017 22:11   Dg Shoulder Left Port  Result Date: 09/29/2017 CLINICAL DATA:  83 y/o F; status post fall. Postop left shoulder reduction. EXAM: LEFT SHOULDER - 1 VIEW COMPARISON:  None. FINDINGS: Comminuted left proximal humerus fracture involving surgical neck and greater tubercle. Surgical neck fracture has apex anterior angulation at the shaft is mildly anteriorly displaced relative to the head. Mild inferior subluxation of humeral head relative to glenoid on transscapular Y-view. Normal acromioclavicular and coracoclavicular intervals. IMPRESSION: Comminuted left proximal humerus fracture involving surgical neck and greater tubercle. Persistent apex anterior angulation of neck fracture with mild anterior displacement of shaft relative to head. Electronically Signed   By: Kristine Garbe M.D.   On: 09/29/2017 03:47   Dg Humerus Left  Result Date: 09/24/2017 CLINICAL DATA:  Left upper arm pain, bruising and deformity following a fall yesterday. EXAM: LEFT HUMERUS - 2+ VIEW COMPARISON:  None. FINDINGS: Comminuted fracture of the left humeral head and neck with marked medial and posterior displacement of the  distal fragment. There is an exostosis arising from the distal humeral shaft. Diffuse osteopenia. IMPRESSION: Comminuted and markedly displaced left humeral head and neck fracture. Electronically Signed   By: Claudie Revering M.D.   On: 09/24/2017 21:09   Dg C-arm 1-60 Min  Result Date: 09/28/2017 CLINICAL DATA:  Postreduction LEFT shoulder fracture. EXAM: LEFT SHOULDER - 2+ VIEW; DG C-ARM 61-120 MIN FLUOROSCOPY TIME:  8 seconds COMPARISON:  CT LEFT shoulder to September 25, 2017. FINDINGS: Two procedure fluoroscopic spot views LEFT shoulder. Interpreting radiologist was not present at time of procedure. LEFT humeral head fracture. IMPRESSION: LEFT humeral head fracture. Electronically Signed   By: Elon Alas M.D.   On: 09/28/2017 22:11   Dg Outside Films Extremity  Result Date: 09/24/2017 This examination belongs to an outside facility and is stored here for comparison purposes only.  Contact the originating outside institution for any associated report or interpretation.   Lab Data:  CBC: Recent Labs  Lab 09/25/17 0052 09/26/17 0517 09/27/17 0442 09/29/17 0524  WBC 5.4 4.8 4.6 4.2  NEUTROABS 3.8  --   --   --  HGB 8.8* 7.5* 10.7* 10.8*  HCT 25.0* 22.6* 31.5* 32.0*  MCV 87.7 90.0 90.3 90.9  PLT 106* 109* 116* 194*   Basic Metabolic Panel: Recent Labs  Lab 09/25/17 0052 09/26/17 0517 09/27/17 0442 09/29/17 0524  NA 127* 127* 126* 129*  K 3.9 3.8 4.1 3.7  CL 94* 93* 93* 94*  CO2 26 26 26 27   GLUCOSE 116* 118* 94 101*  BUN 16 16 10 6   CREATININE 0.63 0.58 0.46 0.37*  CALCIUM 8.9 8.3* 8.5* 8.4*   GFR: Estimated Creatinine Clearance: 39.3 mL/min (A) (by C-G formula based on SCr of 0.37 mg/dL (L)). Liver Function Tests: No results for input(s): AST, ALT, ALKPHOS, BILITOT, PROT, ALBUMIN in the last 168 hours. No results for input(s): LIPASE, AMYLASE in the last 168 hours. No results for input(s): AMMONIA in the last 168 hours. Coagulation Profile: Recent Labs  Lab  09/25/17 0052 09/26/17 0517 09/27/17 0442 09/28/17 0538 09/29/17 0524  INR 2.79 2.76 1.34 1.28 1.49   Cardiac Enzymes: Recent Labs  Lab 09/25/17 0052  TROPONINI 0.03*   BNP (last 3 results) No results for input(s): PROBNP in the last 8760 hours. HbA1C: No results for input(s): HGBA1C in the last 72 hours. CBG: No results for input(s): GLUCAP in the last 168 hours. Lipid Profile: No results for input(s): CHOL, HDL, LDLCALC, TRIG, CHOLHDL, LDLDIRECT in the last 72 hours. Thyroid Function Tests: No results for input(s): TSH, T4TOTAL, FREET4, T3FREE, THYROIDAB in the last 72 hours. Anemia Panel: Recent Labs    09/29/17 0524  VITAMINB12 428  FOLATE 9.5  FERRITIN 113  TIBC 417  IRON 72   Urine analysis:    Component Value Date/Time   COLORURINE AMBER (A) 09/25/2017 0215   APPEARANCEUR HAZY (A) 09/25/2017 0215   LABSPEC 1.024 09/25/2017 0215   PHURINE 5.0 09/25/2017 0215   GLUCOSEU NEGATIVE 09/25/2017 0215   HGBUR NEGATIVE 09/25/2017 0215   BILIRUBINUR NEGATIVE 09/25/2017 0215   KETONESUR NEGATIVE 09/25/2017 0215   PROTEINUR NEGATIVE 09/25/2017 0215   NITRITE NEGATIVE 09/25/2017 0215   LEUKOCYTESUR MODERATE (A) 09/25/2017 0215     Tamarius Rosenfield M.D. [facility] 09/29/2017, 10:58 AM  Pager: [phone] Between 7am to 7pm - call Pager - [phone]  After 7pm go to [email or web address] - password [email or web address]  Call night coverage person covering after 7pm

## 2017-09-29 NOTE — Progress Notes (Signed)
ANTICOAGULATION CONSULT NOTE - Initial Consult  Pharmacy Consult for warfarin Indication: atrial fibrillation and VTE prophylaxis  Allergies  Allergen Reactions  . Azithromycin Other (See Comments)    unknown  . Ciprofloxacin     Felt like her head was on fire    Patient Measurements: Height: 5\' 6"  (167.6 cm) Weight: 120 lb (54.4 kg) IBW/kg (Calculated) : 59.3  Assessment: 83 y/o female on Coumadin 2mg  daily PTA for Afib. Held for shoulder replacement on 12/4 and then resumed that night. INR up to 1.49. Hgb 10.8, plts 118.  Goal of Therapy:  INR 2-3 Monitor platelets by anticoagulation protocol: Yes   Plan:  Give Coumadin 2mg  PO x 1 tonight Monitor daily INR, CBC, s/s of bleed  May discharge today, would restart Coumadin 2mg  PO daily  Elenor Quinones, PharmD, [facility] Clinical Pharmacist Pager [phone] 09/29/2017 8:25 AM

## 2017-09-29 NOTE — Progress Notes (Signed)
Subjective: Pt stable - pain ok   Objective: Vital signs in last 24 hours: Temp:  [97.5 F (36.4 C)-98.3 F (36.8 C)] 97.7 F (36.5 C) (12/05 0500) Pulse Rate:  [77-96] 80 (12/05 0500) Resp:  [16-27] 18 (12/05 0500) BP: (133-176)/(68-93) 146/68 (12/05 0500) SpO2:  [91 %-96 %] 95 % (12/05 0500)  Intake/Output from previous day: 12/04 0701 - 12/05 0700 In: 935.5 [I.V.:935.5] Out: 1150 [Urine:1150] Intake/Output this shift: No intake/output data recorded.  Exam:  Compartment soft  Labs: Recent Labs    09/27/17 0442 09/29/17 0524  HGB 10.7* 10.8*   Recent Labs    09/27/17 0442 09/29/17 0524  WBC 4.6 4.2  RBC 3.49* 3.52*  HCT 31.5* 32.0*  PLT 116* 118*   Recent Labs    09/27/17 0442 09/29/17 0524  NA 126* 129*  K 4.1 3.7  CL 93* 94*  CO2 26 27  BUN 10 6  CREATININE 0.46 0.37*  GLUCOSE 94 101*  CALCIUM 8.5* 8.4*   Recent Labs    09/28/17 0538 09/29/17 0524  INR 1.28 1.49    Assessment/Plan: Plan dc and rov next week   [employer] 09/29/2017, 8:08 AM

## 2017-09-30 LAB — PROTIME-INR
INR: 2.22
Prothrombin Time: 24.4 seconds — ABNORMAL HIGH (ref 11.4–15.2)

## 2017-09-30 LAB — CBC
HCT: 35.3 % — ABNORMAL LOW (ref 36.0–46.0)
Hemoglobin: 11.6 g/dL — ABNORMAL LOW (ref 12.0–15.0)
MCH: 30.2 pg (ref 26.0–34.0)
MCHC: 32.9 g/dL (ref 30.0–36.0)
MCV: 91.9 fL (ref 78.0–100.0)
Platelets: 146 10*3/uL — ABNORMAL LOW (ref 150–400)
RBC: 3.84 MIL/uL — ABNORMAL LOW (ref 3.87–5.11)
RDW: 15 % (ref 11.5–15.5)
WBC: 4.3 10*3/uL (ref 4.0–10.5)

## 2017-09-30 LAB — BASIC METABOLIC PANEL
ANION GAP: 5 (ref 5–15)
BUN: 5 mg/dL — ABNORMAL LOW (ref 6–20)
CALCIUM: 8.6 mg/dL — AB (ref 8.9–10.3)
CO2: 30 mmol/L (ref 22–32)
Chloride: 93 mmol/L — ABNORMAL LOW (ref 101–111)
Creatinine, Ser: 0.42 mg/dL — ABNORMAL LOW (ref 0.44–1.00)
GFR calc non Af Amer: >60 mL/min (ref >60)
GLUCOSE: 100 mg/dL — AB (ref 65–99)
Potassium: 3.7 mmol/L (ref 3.5–5.1)
Sodium: 128 mmol/L — ABNORMAL LOW (ref 135–145)

## 2017-09-30 MED ORDER — COUMADIN 2 MG PO TABS: 2.0000 mg | ORAL_TABLET | Freq: Every day | ORAL | 1 refills | Status: DC

## 2017-09-30 MED ORDER — HYDROCODONE-ACETAMINOPHEN 5-325 MG PO TABS: 1.0000 | ORAL_TABLET | Freq: Four times a day (QID) | ORAL | 0 refills | Status: DC | PRN

## 2017-09-30 MED ORDER — WARFARIN 0.5 MG HALF TABLET
0.5000 mg | ORAL_TABLET | Freq: Once | ORAL | Status: DC
  Filled 2017-09-30: qty 1

## 2017-09-30 MED ORDER — SENNOSIDES-DOCUSATE SODIUM 8.6-50 MG PO TABS: 1.0000 | ORAL_TABLET | Freq: Two times a day (BID) | ORAL | Status: DC

## 2017-09-30 MED ORDER — SENNOSIDES-DOCUSATE SODIUM 8.6-50 MG PO TABS: 1.0000 | ORAL_TABLET | Freq: Every day | ORAL | Status: AC

## 2017-09-30 MED ORDER — POLYETHYLENE GLYCOL 3350 17 G PO PACK: 17.0000 g | PACK | Freq: Every day | ORAL | Status: DC | PRN

## 2017-09-30 MED ORDER — POLYETHYLENE GLYCOL 3350 17 G PO PACK: 17.0000 g | PACK | Freq: Every day | ORAL | 0 refills | Status: AC | PRN

## 2017-09-30 MED ORDER — COUMADIN 2 MG PO TABS: ORAL_TABLET | ORAL | 1 refills | Status: AC

## 2017-09-30 NOTE — Social Work (Addendum)
CSW contact son and discussed SNF placement. CSW provided list of SNf;s in [place]. CSW f/u for bed offers and son will review as well.  CSW received call back from son indicating that they accept bed offer from [facility]. CSW contacted admissions at [facility] and they confirmed.  CSW will f/u for disposition.   Elissa Hefty, LCSW Clinical Social Worker [phone]

## 2017-09-30 NOTE — Social Work (Signed)
CSW sent offers to SNF's in [place].  CSW f/u.  CSW called niece Hoyle Sauer, unable to reach. CSW will f/u.  Pt will dc today.  Elissa Hefty, LCSW Clinical Social Worker [phone]

## 2017-09-30 NOTE — Progress Notes (Signed)
Orthopedic Tech Progress Note Patient Details:  Judith Hunter May 18, 1924 [id number]  Ortho Devices Type of Ortho Device: Shoulder immobilizer Ortho Device/Splint Interventions: Application   Post Interventions Patient Tolerated: Well Instructions Provided: Care of device   [name] 09/30/2017, 10:26 AM

## 2017-09-30 NOTE — Plan of Care (Signed)
  Clinical Measurements: Will remain free from infection 09/30/2017 1324 - Completed/Met by Governor Rooks, RN   Activity: Risk for activity intolerance will decrease 09/30/2017 1324 - Adequate for Discharge by Governor Rooks, RN   Coping: Level of anxiety will decrease 09/30/2017 1324 - Adequate for Discharge by Governor Rooks, RN   Pain Managment: General experience of comfort will improve 09/30/2017 1324 - Adequate for Discharge by Governor Rooks, RN   Safety: Ability to remain free from injury will improve 09/30/2017 1324 - Adequate for Discharge by Governor Rooks, RN

## 2017-09-30 NOTE — Discharge Summary (Signed)
Physician Discharge Summary   Patient ID: Judith Hunter MRN: [id number] DOB/AGE: November 17, 1923 83 y.o.  Admit date: 09/24/2017 Discharge date: 09/30/2017  Primary Care Physician:  Patient, No Pcp Per  Discharge Diagnoses:    .  Closed fracture of head of left humerus . Chronic A-fib ([place]) . Normocytic anemia with acute blood loss after surgery Thrombocytopenia Chronic hyponatremia Chronic lower extremity edema   Consults: Orthopedics, Dr. Marlou Sa  Recommendations for Outpatient Follow-up:  1. PT OT evaluation recommended skilled nursing facility 2. Please repeat CBC/BMET next week, PT/INR tomorrow 10/01/17 3. Follow-up patient with Dr. Marlou Sa in 2 weeks   DIET: Heart healthy diet    Allergies:   Allergies  Allergen Reactions  . Azithromycin Other (See Comments)    unknown  . Ciprofloxacin     Felt like her head was on fire     DISCHARGE MEDICATIONS: Allergies as of 09/30/2017      Reactions   Azithromycin Other (See Comments)   unknown   Ciprofloxacin    Felt like her head was on fire      Medication List    TAKE these medications   COUMADIN 2 MG tablet Generic drug:  warfarin Take Coumadin 0.5 mg today on 12/6, recheck PT/INR on 12/7.  Patient may resume her maintenance dose of 2 mg daily on 12/7 if INR is between 2 and 3 What changed:    how much to take  how to take this  when to take this  additional instructions   furosemide 20 MG tablet Commonly known as:  LASIX Take 20-40 mg by mouth See admin instructions. Alternate 20 mg and 40 mg every other day   HYDROcodone-acetaminophen 5-325 MG tablet Commonly known as:  NORCO/VICODIN Take 1 tablet by mouth every 6 (six) hours as needed for moderate pain or severe pain.   polyethylene glycol packet Commonly known as:  MIRALAX / GLYCOLAX Take 17 g by mouth daily as needed for severe constipation.   senna-docusate 8.6-50 MG tablet Commonly known as:  Senokot-S Take 1 tablet by mouth at bedtime.    TOPROL XL 100 MG 24 hr tablet Generic drug:  metoprolol succinate Take 50 mg by mouth 2 (two) times daily.        Brief H and P: For complete details please refer to admission H and P, but in brief Judith Hunter is a 53 yofemalewith PMH chronic a-fib on coumadin, who had a mechanical fall 11/29 with resultant comminuted,markedly displaced left humeral head and neck fracture, presented to [facility],admitted on 12/66for left humerus fracture needing repair. Ortho consulted however, due to elevated INR on coumadin, surgery was postponed. She was anemic, worsening to hgb 7.5 on 12/2, improved to 10.7 with transfusions.    Hospital Course:  Acute comminuted displaced closed fracture of head of left humerus -Status post closed reduction left proximal humerus fracture, postop day # 2 -Currently pain controlled -PT OT recommended skilled nursing facility for rehab     A-fib [facility]), chronic -Continue metoprolol -Coumadin restarted after surgery, follow PT/INR -INR 2.2 today, pharmacy recommending 0.5 mg Coumadin today on 12/6, follow PT/INR on 12/7.  Patient may resume her maintenance dose of Coumadin 2 mg daily if INR therapeutic between 2 and 3  Normocytic anemia with acute blood loss with surgery -Improved after 2 units packed RBCs, currently stable  Hyponatremia, chronic -Stable  Thrombocytopenia -Improving and stable  Chronic lower extremity edema -Continue Lasix 20 mg, 40 mg alternating    Day of  Discharge BP (!) 166/71 (BP Location: Right Arm)   Pulse 74   Temp 97.9 F (36.6 C) (Oral)   Resp 18   Ht 5\' 6"  (1.676 m)   Wt 54.4 kg (120 lb)   SpO2 96%   BMI 19.37 kg/m   Physical Exam: General: Alert and awake oriented x3 not in any acute distress. HEENT: anicteric sclera, pupils reactive to light and accommodation CVS: S1-S2 clear no murmur rubs or gallops Chest: clear to auscultation bilaterally, no wheezing rales or rhonchi Abdomen: soft  nontender, nondistended, normal bowel sounds Extremities: no cyanosis, clubbing or edema noted bilaterally Neuro: no new deficit   The results of significant diagnostics from this hospitalization (including imaging, microbiology, ancillary and laboratory) are listed below for reference.    LAB RESULTS: Basic Metabolic Panel: Recent Labs  Lab 09/29/17 0524 09/30/17 0607  NA 129* 128*  K 3.7 3.7  CL 94* 93*  CO2 27 30  GLUCOSE 101* 100*  BUN 6 5*  CREATININE 0.37* 0.42*  CALCIUM 8.4* 8.6*   Liver Function Tests: No results for input(s): AST, ALT, ALKPHOS, BILITOT, PROT, ALBUMIN in the last 168 hours. No results for input(s): LIPASE, AMYLASE in the last 168 hours. No results for input(s): AMMONIA in the last 168 hours. CBC: Recent Labs  Lab 09/25/17 0052  09/29/17 0524 09/30/17 0607  WBC 5.4   < > 4.2 4.3  NEUTROABS 3.8  --   --   --   HGB 8.8*   < > 10.8* 11.6*  HCT 25.0*   < > 32.0* 35.3*  MCV 87.7   < > 90.9 91.9  PLT 106*   < > 118* 146*   < > = values in this interval not displayed.   Cardiac Enzymes: Recent Labs  Lab 09/25/17 0052  TROPONINI 0.03*   BNP: Invalid input(s): POCBNP CBG: No results for input(s): GLUCAP in the last 168 hours.  Significant Diagnostic Studies:  Dg Chest 2 View  Result Date: 09/25/2017 CLINICAL DATA:  Preop left humerus fracture. EXAM: CHEST  2 VIEW COMPARISON:  Chest radiograph yesterday FINDINGS: Stable cardiomegaly. Unchanged diffuse coarse interstitial opacities, probable peripheral scarring in the upper lungs. Possible small pleural effusions. No pneumothorax. Displaced left proximal humerus fracture. Bones are under mineralized. Compression fracture in the lower thoracic spine, age indeterminate. IMPRESSION: 1. Cardiomegaly. Coarse interstitial opacities may be chronic bronchitic change or pulmonary edema. Small pleural effusions. Findings are unchanged from radiograph yesterday. 2. Displaced left proximal humerus fracture.  Compression deformity in the lower thoracic spine, age indeterminate. Electronically Signed   By: Jeb Levering M.D.   On: 09/25/2017 00:42   Ct Head Wo Contrast  Result Date: 09/24/2017 CLINICAL DATA:  83 y/o F; fall with head injury and bruising to the left eye. EXAM: CT HEAD WITHOUT CONTRAST CT CERVICAL SPINE WITHOUT CONTRAST TECHNIQUE: Multidetector CT imaging of the head and cervical spine was performed following the standard protocol without intravenous contrast. Multiplanar CT image reconstructions of the cervical spine were also generated. COMPARISON:  None. FINDINGS: CT HEAD FINDINGS Brain: No evidence of acute infarction, hemorrhage, hydrocephalus, extra-axial collection or mass lesion/mass effect. Mild chronic microvascular ischemic changes and parenchymal volume loss of the brain. Vascular: Calcific atherosclerosis of carotid siphons and vertebrobasilar system. Skull: Normal. Negative for fracture or focal lesion. Sinuses/Orbits: No acute finding. 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 CT CERVICAL SPINE FINDINGS Alignment: Grade 1  C4-5, C7-T1, and T1-2 anterolisthesis. Straightening of cervical lordosis. Skull base and vertebrae: No acute fracture. No primary bone lesion or focal pathologic process. Soft tissues and spinal canal: No prevertebral fluid or swelling. No visible canal hematoma. Disc levels: Moderate discogenic degenerative changes at the C5-C7 levels with loss of disc space height and endplate sclerosis. Prominent upper cervical facet arthrosis. Uncovertebral and facet hypertrophy encroaches on the neural foramen bilaterally greatest at the C5-6 and C6-7 levels. Upper chest: Small left pleural effusion. Other: Calcific atherosclerosis of carotid siphons. Edema and left supraclavicular fossa and axilla. IMPRESSION: CT head: 1. No acute intracranial abnormality or calvarial  fracture. 2. Mild chronic microvascular ischemic changes and parenchymal volume loss of the brain. 3. TMJ osteoarthrosis. CT cervical spine: 1. No acute fracture or dislocation identified. 2. Moderate cervical spondylosis greatest at C5-C7 levels. 3. Small left pleural effusion and edema within the left supraclavicular fossa and axilla partially visualized. Electronically Signed   By: Kristine Garbe M.D.   On: 09/24/2017 20:50   Ct Cervical Spine Wo Contrast  Result Date: 09/24/2017 CLINICAL DATA:  83 y/o F; fall with head injury and bruising to the left eye. EXAM: CT HEAD WITHOUT CONTRAST CT CERVICAL SPINE WITHOUT CONTRAST TECHNIQUE: Multidetector CT imaging of the head and cervical spine was performed following the standard protocol without intravenous contrast. Multiplanar CT image reconstructions of the cervical spine were also generated. COMPARISON:  None. FINDINGS: CT HEAD FINDINGS Brain: No evidence of acute infarction, hemorrhage, hydrocephalus, extra-axial collection or mass lesion/mass effect. Mild chronic microvascular ischemic changes and parenchymal volume loss of the brain. Vascular: Calcific atherosclerosis of carotid siphons and vertebrobasilar system. Skull: Normal. Negative for fracture or focal lesion. Sinuses/Orbits: No acute finding. 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 CT CERVICAL SPINE FINDINGS Alignment: Grade 1 C4-5, C7-T1, and T1-2 anterolisthesis. Straightening of cervical lordosis. Skull base and vertebrae: No acute fracture. No primary bone lesion or focal pathologic process. Soft tissues and spinal canal: No prevertebral fluid or swelling. No visible canal hematoma. Disc levels: Moderate discogenic degenerative changes at the C5-C7 levels with loss of disc space height and endplate sclerosis. Prominent upper cervical facet arthrosis. Uncovertebral and facet  hypertrophy encroaches on the neural foramen bilaterally greatest at the C5-6 and C6-7 levels. Upper chest: Small left pleural effusion. Other: Calcific atherosclerosis of carotid siphons. Edema and left supraclavicular fossa and axilla. IMPRESSION: CT head: 1. No acute intracranial abnormality or calvarial fracture. 2. Mild chronic microvascular ischemic changes and parenchymal volume loss of the brain. 3. TMJ osteoarthrosis. CT cervical spine: 1. No acute fracture or dislocation identified. 2. Moderate cervical spondylosis greatest at C5-C7 levels. 3. Small left pleural effusion and edema within the left supraclavicular fossa and axilla partially visualized. Electronically Signed   By: Kristine Garbe M.D.   On: 09/24/2017 20:50   Ct Shoulder Left Wo Contrast  Result Date: 09/25/2017 CLINICAL DATA:  Proximal humerus fracture. EXAM: CT OF THE UPPER LEFT EXTREMITY WITHOUT CONTRAST TECHNIQUE: Multidetector CT imaging of the upper left extremity was performed according to the standard protocol. COMPARISON:  Left humerus x-rays from yesterday. FINDINGS: Bones/Joint/Cartilage Complex fracture of the proximal humerus. There is a comminuted, predominantly transverse fracture through the surgical neck with anterior and medial displacement of the distal humerus by 1 bone shaft width. There is also a minimally displaced vertical fracture through the greater tuberosity. The humeral head is abducted and internally rotated.  Moderate glenohumeral joint effusion. Ligaments Suboptimally assessed by CT. Muscles and Tendons No focal abnormality. Soft tissues Cardiomegaly. Moderate left pleural effusion with evidence of interstitial and alveolar edema. Diffuse anasarca. IMPRESSION: 1. Complex, comminuted, displaced fracture of the proximal humerus involving the surgical neck and greater tuberosity as described above. 2. Cardiomegaly with moderate left pleural effusion and pulmonary interstitial and alveolar edema.  Electronically Signed   By: Titus Dubin M.D.   On: 09/25/2017 16:09   Dg Humerus Left  Result Date: 09/24/2017 CLINICAL DATA:  Left upper arm pain, bruising and deformity following a fall yesterday. EXAM: LEFT HUMERUS - 2+ VIEW COMPARISON:  None. FINDINGS: Comminuted fracture of the left humeral head and neck with marked medial and posterior displacement of the distal fragment. There is an exostosis arising from the distal humeral shaft. Diffuse osteopenia. IMPRESSION: Comminuted and markedly displaced left humeral head and neck fracture. Electronically Signed   By: Claudie Revering M.D.   On: 09/24/2017 21:09   Dg Outside Films Extremity  Result Date: 09/24/2017 This examination belongs to an outside facility and is stored here for comparison purposes only.  Contact the originating outside institution for any associated report or interpretation.   2D ECHO:   Disposition and Follow-up:    DISPOSITION: [facility]    Marlou Sa, Tonna Corner, MD. Schedule an appointment as soon as possible for a visit in 2 week(s).   Specialty:  Orthopedic Surgery Contact information: [place] [address] [phone]            Time spent on Discharge: 35 mins   Signed:   Estill Cotta M.D. [facility] 09/30/2017, 1:06 PM Pager: [phone]

## 2017-09-30 NOTE — Progress Notes (Signed)
ANTICOAGULATION CONSULT NOTE - Initial Consult  Pharmacy Consult for warfarin Indication: atrial fibrillation and VTE prophylaxis  Allergies  Allergen Reactions  . Azithromycin Other (See Comments)    unknown  . Ciprofloxacin     Felt like her head was on fire    Patient Measurements: Height: 5\' 6"  (167.6 cm) Weight: 120 lb (54.4 kg) IBW/kg (Calculated) : 59.3  Assessment: 83 y/o female on Coumadin 2mg  daily PTA for Afib. Held for shoulder replacement on 12/4 and then resumed that night. INR went up to 2.22 today. Hgb 11.6, plts 146.  Goal of Therapy:  INR 2-3 Monitor platelets by anticoagulation protocol: Yes   Plan:  Give Coumadin 0.5 mg PO x 1 tonight Monitor daily INR, CBC, s/s of bleed  May discharge today, would consider lower dose such as Coumadin 2mg  daily exc for 1mg  on MWF  Elenor Quinones, PharmD, [facility] Clinical Pharmacist Pager [phone] 09/30/2017 8:43 AM

## 2017-09-30 NOTE — NC FL2 (Signed)
[place] LEVEL OF CARE SCREENING TOOL     IDENTIFICATION  Patient Name: Judith Hunter Birthdate: [birth year] Sex: female Admission Date (Current Location): 09/24/2017  [facility] and [place] Number:  Herbalist and Address:  [facility], [place] [address] [place], [place] [address]      Provider Number: [id number]  Attending Physician Name and Address:  Mendel Corning, MD  Relative Name and Phone Number:  Shearon Stalls, son, [phone], Yaakov Guthrie [phone]    Current Level of Care: Hospital Recommended Level of Care: [place] Prior Approval Number:    Date Approved/Denied:   PASRR Number:    Discharge Plan: SNF    Current Diagnoses: Patient Active Problem List   Diagnosis Date Noted  . Pain   . Shoulder fracture, left 09/28/2017  . Closed fracture of head of left humerus 09/25/2017  . A-fib ([place]) 09/25/2017    Orientation RESPIRATION BLADDER Height & Weight     Self, Place, Situation  Normal Continent Weight: 120 lb (54.4 kg) Height:  5\' 6"  (167.6 cm)  BEHAVIORAL SYMPTOMS/MOOD NEUROLOGICAL BOWEL NUTRITION STATUS      Continent Diet(See DC Summary)  AMBULATORY STATUS COMMUNICATION OF NEEDS Skin   Limited Assist Verbally Surgical wounds                       Personal Care Assistance Level of Assistance  Bathing, Feeding, Dressing Bathing Assistance: Limited assistance Feeding assistance: Limited assistance Dressing Assistance: Limited assistance     Functional Limitations Info  Hearing, Speech, Sight Sight Info: Adequate Hearing Info: Adequate Speech Info: Adequate    SPECIAL CARE FACTORS FREQUENCY  PT (By licensed PT)     PT Frequency: 5x week              Contractures Contractures Info: Not present    Additional Factors Info  Code Status, Allergies, Isolation Precautions Code Status Info: Full Allergies Info: AZITHROMYCIN, CIPROFLOXACIN      Isolation  Precautions Info: MRSA     Current Medications (09/30/2017):  This is the current hospital active medication list Current Facility-Administered Medications  Medication Dose Route Frequency Provider Last Rate Last Dose  . acetaminophen (TYLENOL) tablet 650 mg  650 mg Oral Q6H PRN Etta Quill, DO   650 mg at 09/27/17 1055   Or  . acetaminophen (TYLENOL) suppository 650 mg  650 mg Rectal Q6H PRN Etta Quill, DO      . acetaminophen (TYLENOL) tablet 650 mg  650 mg Oral Q4H PRN Meredith Pel, MD       Or  . acetaminophen (TYLENOL) suppository 650 mg  650 mg Rectal Q4H PRN Meredith Pel, MD      . Chlorhexidine Gluconate Cloth 2 % PADS 6 each  6 each Topical Q0600 Kayleen Memos, DO   6 each at 09/30/17 0455  . furosemide (LASIX) tablet 20 mg  20 mg Oral Liz Beach M, DO   20 mg at 09/30/17 0831  . furosemide (LASIX) tablet 40 mg  40 mg Oral Liz Beach M, DO   40 mg at 09/27/17 0801  . HYDROcodone-acetaminophen (NORCO/VICODIN) 5-325 MG per tablet 1-2 tablet  1-2 tablet Oral Q6H PRN Etta Quill, DO   2 tablet at 09/30/17 0601  . lactated ringers infusion   Intravenous Continuous Duane Boston, MD 10 mL/hr at 09/28/17 2107    . metoCLOPramide (REGLAN) tablet 5-10 mg  5-10  mg Oral Q8H PRN Meredith Pel, MD       Or  . metoCLOPramide [facility]) injection 5-10 mg  5-10 mg Intravenous Q8H PRN Meredith Pel, MD   10 mg at 09/29/17 1741  . metoprolol succinate (TOPROL-XL) 24 hr tablet 50 mg  50 mg Oral BID Janett Billow, RPH   50 mg at 09/30/17 0831  . morphine 2 MG/ML injection 2-4 mg  2-4 mg Intravenous Q2H PRN Patrecia Pour, MD   4 mg at 09/29/17 1741  . mupirocin ointment (BACTROBAN) 2 % 1 application  1 application Nasal BID Kayleen Memos, DO   1 application at 16/01/09 3235  . ondansetron (ZOFRAN) tablet 4 mg  4 mg Oral Q6H PRN Etta Quill, DO       Or  . ondansetron [facility]) injection 4 mg  4 mg Intravenous Q6H PRN Etta Quill, DO      . ondansetron [facility]) tablet 4 mg  4 mg Oral Q6H PRN Meredith Pel, MD       Or  . ondansetron [facility]) injection 4 mg  4 mg Intravenous Q6H PRN Meredith Pel, MD      . phenol [facility]) mouth spray 1 spray  1 spray Mouth/Throat PRN Patrecia Pour, MD   1 spray at 09/28/17 2239  . warfarin (COUMADIN) tablet 0.5 mg  0.5 mg Oral ONCE-1800 Reginia Naas, RPH      . Warfarin - Pharmacist Dosing Inpatient   Does not apply [place], [facility]         Discharge Medications: Please see discharge summary for a list of discharge medications.  Relevant Imaging Results:  Relevant Lab Results:   Additional Information SS#:[id number] [place], LCSW

## 2017-09-30 NOTE — Progress Notes (Signed)
Occupational Therapy Treatment Patient Details Name: Judith Hunter MRN: [id number] DOB: 03-Jul-1924 Today's Date: 09/30/2017    History of present illness 83 yo female with recent fall leading to L humerus fracture, surgically addresed with closed reduction; hx of A-fib rate controlled   OT comments  This 83 yo female same transfer status (min A). Did initiate composite finger flexion and extension this session (no more ROM than this due to per Dr. Randel Pigg post op note immobolizer sling at all times x1 week.   Follow Up Recommendations  SNF    Equipment Recommendations  Other (comment)(TBD next venue)       Precautions / Restrictions Precautions Precautions: Fall Restrictions Weight Bearing Restrictions: Yes LUE Weight Bearing: Non weight bearing Other Position/Activity Restrictions: Per Dr. Marlou Sa op note, shoulder immobilizer sling at all times for 1 week       Mobility Bed Mobility Overal bed mobility: Needs Assistance Bed Mobility: Supine to Sit     Supine to sit: Min assist     General bed mobility comments: increased time, use of bed rail, HOB up, cues for sequencing  Transfers Overall transfer level: Needs assistance Equipment used: 1 person hand held assist Transfers: Sit to/from [facility] Sit to Stand: Min assist Stand pivot transfers: Min assist            Balance Overall balance assessment: Needs assistance Sitting-balance support: Single extremity supported;Feet unsupported Sitting balance-Leahy Scale: Poor     Standing balance support: Single extremity supported Standing balance-Leahy Scale: Poor Standing balance comment: Cannot maintain standing without support                           ADL either performed or assessed with clinical judgement   ADL Overall ADL's : Needs assistance/impaired Eating/Feeding: Set up Eating/Feeding Details (indicate cue type and reason): supported sitting Grooming: Modified  independent Grooming Details (indicate cue type and reason): supported sitting Upper Body Bathing: Maximal assistance Upper Body Bathing Details (indicate cue type and reason): supported sitting Lower Body Bathing: Maximal assistance Lower Body Bathing Details (indicate cue type and reason): min A sit<>stand Upper Body Dressing : Total assistance Upper Body Dressing Details (indicate cue type and reason): supported sitting Lower Body Dressing: Total assistance Lower Body Dressing Details (indicate cue type and reason): min A sit<>stand Toilet Transfer: Minimal assistance;Stand-pivot Toilet Transfer Details (indicate cue type and reason): bed>recliner going to her right Toileting- Clothing Manipulation and Hygiene: Total assistance Toileting - Clothing Manipulation Details (indicate cue type and reason): min A sit<.stand             Vision Patient Visual Report: No change from baseline            Cognition Arousal/Alertness: Awake/alert Behavior During Therapy: WFL for tasks assessed/performed Overall Cognitive Status: Within Functional Limits for tasks assessed                                          Exercises Other Exercises Other Exercises: Had pt perform 10 reps of composite digit flexion/extension, 2+ pitting edema. repostioned in sling for edema control and ice applied           Pertinent Vitals/ Pain       Pain Assessment: 0-10 Pain Score: 4  Pain Location: L UE  Pain Descriptors / Indicators: Aching;Heaviness;Sore Pain Intervention(s): Limited activity within patient's tolerance;Monitored  during session;Repositioned;Ice applied  Home Living Family/patient expects to be discharged to:: Skilled nursing facility Living Arrangements: Alone                                          Frequency  Min 2X/week        Progress Toward Goals  OT Goals(current goals can now be found in the care plan section)     Acute Rehab OT  Goals Patient Stated Goal: to get where I can move again OT Goal Formulation: With patient Time For Goal Achievement: 10/14/17 Potential to Achieve Goals: Good ADL Goals Pt Will Perform Grooming: with min assist;sitting Pt Will Transfer to Toilet: with min guard assist;stand pivot transfer;bedside commode Pt/caregiver will Perform Home Exercise Program: Left upper extremity;With Supervision  Plan Discharge plan remains appropriate       AM-PAC PT "6 Clicks" Daily Activity     Outcome Measure   Help from another person eating meals?: A Little Help from another person taking care of personal grooming?: A Lot Help from another person toileting, which includes using toliet, bedpan, or urinal?: Total Help from another person bathing (including washing, rinsing, drying)?: A Lot Help from another person to put on and taking off regular upper body clothing?: Total Help from another person to put on and taking off regular lower body clothing?: Total 6 Click Score: 10    End of Session Equipment Utilized During Treatment: Other (comment)(LUE sling)  OT Visit Diagnosis: Unsteadiness on feet (R26.81);Other abnormalities of gait and mobility (R26.89);Pain;History of falling (Z91.81) Pain - Right/Left: Left Pain - part of body: Shoulder   Activity Tolerance Patient tolerated treatment well   Patient Left in chair;with call bell/phone within reach;with chair alarm set   Nurse Communication Mobility status(NT--she was in room to A with toileting this 2nd session and saw how pt stood and pivoted)        Time: 1004-1019 OT Time Calculation (min): 15 min  Charges: OT General Charges $OT Visit: 1 Visit OT Evaluation $OT Eval Moderate Complexity: 1 Mod OT Treatments $Self Care/Home Management : 8-22 mins  Golden Circle, OTR/L [phone] 09/30/2017

## 2017-09-30 NOTE — Clinical Social Work Placement (Signed)
   CLINICAL SOCIAL WORK PLACEMENT  NOTE  Date:  09/30/2017  Patient Details  Name: Judith Hunter MRN: [id number] Date of Birth: 11/06/1923  Clinical Social Work is seeking post-discharge placement for this patient at the [place] level of care (*CSW will initial, date and re-position this form in  chart as items are completed):  Yes   Patient/family provided with [facility] Work Department's list of facilities offering this level of care within the geographic area requested by the patient (or if unable, by the patient's family).  Yes   Patient/family informed of their freedom to choose among providers that offer the needed level of care, that participate in Medicare, Medicaid or managed care program needed by the patient, have an available bed and are willing to accept the patient.  Yes   Patient/family informed of [facility]'s ownership interest in [facility] and [facility], as well as of the fact that they are under no obligation to receive care at these facilities.  PASRR submitted to EDS on       PASRR number received on       Existing PASRR number confirmed on       FL2 transmitted to all facilities in geographic area requested by pt/family on       FL2 transmitted to all facilities within larger geographic area on       Patient informed that his/her managed care company has contracts with or will negotiate with certain facilities, including the following:        Yes   Patient/family informed of bed offers received.  Patient chooses bed at [facility]     Physician recommends and patient chooses bed at      Patient to be transferred to [facility] on 09/30/17.  Patient to be transferred to facility by [facility]     Patient family notified on 09/30/17 of transfer.  Name of family member notified:  son notified     PHYSICIAN       Additional Comment:     _______________________________________________ Normajean Baxter, LCSW 09/30/2017, 1:06 PM

## 2017-09-30 NOTE — Op Note (Signed)
NAME:  Judith Hunter, Judith Hunter                   ACCOUNT NO.:  MEDICAL RECORD NO.:  [id number]  LOCATION:                                 FACILITY:  PHYSICIAN:  Anderson Malta, M.D.    DATE OF BIRTH:  [birth year]  DATE OF PROCEDURE: DATE OF DISCHARGE:                              OPERATIVE REPORT   PREOPERATIVE DIAGNOSIS:  Left shoulder fracture dislocation, proximal humerus.  POSTOPERATIVE DIAGNOSIS:  Left shoulder fracture dislocation, proximal humerus.  PROCEDURE:  Closed reduction of left shoulder proximal humerus fracture.  SURGEON:  Anderson Malta, M.D.  ASSISTANT:  Laure Kidney, RNFA.  INDICATIONS:  Judith Hunter is a 83 year old patient with left shoulder pain after a fall.  She has anteriorly displaced proximal humerus fracture. The head remains located.  She presents now for operative management after explanation of risks and benefits.  PROCEDURE IN DETAIL:  The patient was brought to the operating room where general anesthetic was induced.  Time-out was called with general anesthetic and muscle paralysis on board.  The arm was manipulated into improved position.  The head was remained located and the shaft was brought posteriorly and laterally underneath the head.  All-in-all, a good reduction was achieved in the AP and lateral planes with fluoroscopy.  The shoulder was stable with internal and external rotation.  Shoulder immobilizer with waist strap placed.  Plan to keep her in that shoulder immobilizer full time for the first week.  She is ready for discharge to skilled nursing from orthopedic perspective.     Anderson Malta, M.D.     GSD/MEDQ  D:  09/28/2017  T:  09/29/2017  Job:  [id number]

## 2017-09-30 NOTE — Progress Notes (Signed)
Report called and given to [place] at [facility]. Pt being transported via [facility]. All belongings packed up and sent with her. Pt is no distress at time of transport.

## 2017-09-30 NOTE — Social Work (Signed)
Clinical Social Worker facilitated patient discharge including contacting patient family and facility to confirm patient discharge plans.  Clinical information faxed to facility and family agreeable with plan.    CSW arranged ambulance transport via [facility] to [facility].    RN to call [phone] to give report prior to discharge.  Clinical Social Worker will sign off for now as social work intervention is no longer needed. Please consult [place] again if new need arises.  Elissa Hefty, LCSW Clinical Social Worker [phone]

## 2017-09-30 NOTE — Clinical Social Work Note (Signed)
Clinical Social Work Assessment  Patient Details  Name: Judith Hunter MRN: [id number] Date of Birth: 06-13-1924  Date of referral:  09/30/17               Reason for consult:  Facility Placement                Permission sought to share information with:  Facility Sport and exercise psychologist Permission granted to share information::  Yes, Verbal Permission Granted  Name::     Shearon Stalls, [phone]  Agency::  SNF  Relationship::  son  Contact Information:     Housing/Transportation Living arrangements for the past 2 months:  Single Family Home Source of Information:  Patient Patient Interpreter Needed:  None Criminal Activity/Legal Involvement Pertinent to Current Situation/Hospitalization:  No - Comment as needed Significant Relationships:  Adult Children Lives with:  Self Do you feel safe going back to the place where you live?  No Need for family participation in patient care:  Yes (Comment)  Care giving concerns:  Pt resides alone in home. She was independent with ADL's prior to hospitalization. Pt indicated that she has a niece in the area and a son in [place], however she does not have help at home and is not safe to return at this time. CSW will f/u for [facility] placement in [place] area.  Social Worker assessment / plan:  CSW discussed her role and SNF placement and options. CSW obtained permission to send to [place] area for placement. FL2 completed. Pt reports never experiencing SNf past. CSW answered all questions.  CSW will assist with disposition and transition to SNF today.  Employment status:  Retired Nurse, adult PT Recommendations:  [place] / Referral to community resources:     Patient/Family's Response to care:  Patient appreciative of CSW assistance with SNF placement. No issues or concerns identified.  Patient/Family's Understanding of and Emotional Response to Diagnosis, Current Treatment, and  Prognosis:  Patient has good understanding of diagnosis, current treatment, and prognosis as she understands that she has no one at home to assist and cannot return home in her condition. CSW will assist with transition to SNF. No other issues or concerns identified.  Emotional Assessment Appearance:  Appears stated age Attitude/Demeanor/Rapport:  (Cooperative) Affect (typically observed):  Accepting, Appropriate Orientation:  Oriented to Self, Oriented to Place, Oriented to Situation Alcohol / Substance use:  Not Applicable Psych involvement (Current and /or in the community):  No (Comment)  Discharge Needs  Concerns to be addressed:  Discharge Planning Concerns Readmission within the last 30 days:  No Current discharge risk:  Dependent with Mobility, Physical Impairment Barriers to Discharge:  No Barriers Identified   Normajean Baxter, LCSW 09/30/2017, 12:09 PM

## 2017-09-30 NOTE — Evaluation (Signed)
Occupational Therapy Evaluation Patient Details Name: Judith Hunter MRN: [id number] DOB: [birth year] Today's Date: 09/30/2017    History of Present Illness 83 yo female with recent fall leading to L humerus fracture, surgically addresed with closed reduction; hx of A-fib rate controlled   Clinical Impression   This 83 yo female admitted and underwent above presents to acute OT with decreased use of LUE, increased edema LUE, decreased balance, and increased pain all affecting her PLOF of living alone independently. She will benefit from acute OT with follow up OT at SNF to work back towards PLOF.    Follow Up Recommendations  SNF    Equipment Recommendations  Other (comment)(TBD next venue)       Precautions / Restrictions Precautions Precautions: Fall Restrictions Weight Bearing Restrictions: Yes LUE Weight Bearing: Weight bear through elbow only Other Position/Activity Restrictions: Per Dr. Marlou Sa op note, shoulder immobilizer sling at all times for 1 week      Mobility Bed Mobility Overal bed mobility: Needs Assistance Bed Mobility: Supine to Sit     Supine to sit: Min assist     General bed mobility comments: increased time, use of bed rail, HOB up, cues for sequencing  Transfers Overall transfer level: Needs assistance Equipment used: 1 person hand held assist Transfers: Sit to/from [facility] Sit to Stand: Min assist Stand pivot transfers: Min assist            Balance Overall balance assessment: Needs assistance Sitting-balance support: Single extremity supported;Feet unsupported Sitting balance-Leahy Scale: Poor     Standing balance support: Single extremity supported Standing balance-Leahy Scale: Poor Standing balance comment: Cannot maintain standing without support                           ADL either performed or assessed with clinical judgement   ADL Overall ADL's : Needs assistance/impaired Eating/Feeding: Set  up Eating/Feeding Details (indicate cue type and reason): supported sitting Grooming: Modified independent Grooming Details (indicate cue type and reason): supported sitting Upper Body Bathing: Maximal assistance Upper Body Bathing Details (indicate cue type and reason): supported sitting Lower Body Bathing: Maximal assistance Lower Body Bathing Details (indicate cue type and reason): min A sit<>stand Upper Body Dressing : Total assistance Upper Body Dressing Details (indicate cue type and reason): supported sitting Lower Body Dressing: Total assistance Lower Body Dressing Details (indicate cue type and reason): min A sit<>stand Toilet Transfer: Minimal assistance;Stand-pivot Toilet Transfer Details (indicate cue type and reason): bed>recliner going to her right Toileting- Clothing Manipulation and Hygiene: Total assistance Toileting - Clothing Manipulation Details (indicate cue type and reason): min A sit<.stand             Vision Patient Visual Report: No change from baseline              Pertinent Vitals/Pain Pain Assessment: 0-10 Pain Score: 4  Pain Location: L UE  Pain Descriptors / Indicators: Aching;Heaviness;Sore Pain Intervention(s): Limited activity within patient's tolerance;Monitored during session;Repositioned     Hand Dominance  right   Extremity/Trunk Assessment Upper Extremity Assessment Upper Extremity Assessment: LUE deficits/detail LUE Deficits / Details: closed reduction humeral fx this admission; can move fingers some, 2+ pitting edema (repositioned sling, elevated, ice applied) also put in order and called ortho tech for new left shoulder immobilizer sling or waist strap (her waist strap had been cut and was safety pinned thus not allowing for adjustment as needed) LUE Coordination: decreased fine motor;decreased gross motor  Communication  no issues   Cognition Arousal/Alertness: Awake/alert Behavior During Therapy: WFL for tasks  assessed/performed Overall Cognitive Status: Within Functional Limits for tasks assessed                                                Home Living Family/patient expects to be discharged to:: Skilled nursing facility Living Arrangements: Alone                                               OT Problem List: Decreased strength;Decreased range of motion;Impaired balance (sitting and/or standing);Pain;Increased edema;Decreased knowledge of precautions;Decreased knowledge of use of DME or AE      OT Treatment/Interventions: Self-care/ADL training;Balance training;Therapeutic exercise;DME and/or AE instruction;Patient/family education    OT Goals(Current goals can be found in the care plan section) Acute Rehab OT Goals Patient Stated Goal: to get where I can move again OT Goal Formulation: With patient Time For Goal Achievement: 10/14/17 Potential to Achieve Goals: Good  OT Frequency: Min 2X/week              AM-PAC PT "6 Clicks" Daily Activity     Outcome Measure Help from another person eating meals?: A Little Help from another person taking care of personal grooming?: A Lot Help from another person toileting, which includes using toliet, bedpan, or urinal?: Total Help from another person bathing (including washing, rinsing, drying)?: A Lot Help from another person to put on and taking off regular upper body clothing?: Total Help from another person to put on and taking off regular lower body clothing?: Total 6 Click Score: 10   End of Session Equipment Utilized During Treatment: (LUE sling) Nurse Communication: Mobility status(NT)  Activity Tolerance: Patient tolerated treatment well Patient left: in chair;with call bell/phone within reach;with chair alarm set  OT Visit Diagnosis: Unsteadiness on feet (R26.81);Other abnormalities of gait and mobility (R26.89);Pain;History of falling (Z91.81) Pain - Right/Left: Left Pain - part of body:  Shoulder                Time: 5093-2671 OT Time Calculation (min): 43 min Charges:  OT General Charges $OT Visit: 1 Visit OT Evaluation $OT Eval Moderate Complexity: 1 Mod OT Treatments $Self Care/Home Management : 23-37 mins Golden Circle, OTR/L [phone] 09/30/2017

## 2017-10-11 ENCOUNTER — Ambulatory Visit (INDEPENDENT_AMBULATORY_CARE_PROVIDER_SITE_OTHER): Payer: Medicare Other | Admitting: Orthopedic Surgery

## 2017-10-11 ENCOUNTER — Ambulatory Visit (INDEPENDENT_AMBULATORY_CARE_PROVIDER_SITE_OTHER): Payer: No Typology Code available for payment source

## 2017-10-11 ENCOUNTER — Encounter (INDEPENDENT_AMBULATORY_CARE_PROVIDER_SITE_OTHER): Payer: Self-pay | Admitting: Orthopedic Surgery

## 2017-10-11 DIAGNOSIS — S42292D Other displaced fracture of upper end of left humerus, subsequent encounter for fracture with routine healing: Secondary | ICD-10-CM | POA: Diagnosis not present

## 2017-10-13 ENCOUNTER — Encounter (INDEPENDENT_AMBULATORY_CARE_PROVIDER_SITE_OTHER): Payer: Self-pay | Admitting: Orthopedic Surgery

## 2017-10-13 NOTE — Progress Notes (Signed)
   Post-Op Visit Note   Patient: Judith Hunter           Date of Birth: [birth year]           MRN: [id number] Visit Date: 10/11/2017 PCP: Patient, No Pcp Per   Assessment & Plan:  Chief Complaint:  Chief Complaint  Patient presents with  . Left Shoulder - Routine Post Op   Visit Diagnoses:  1. Closed fracture of head of left humerus with routine healing, subsequent encounter     Plan: Judith Hunter is a 83 year old patient with left proximal humerus fracture.  Closed reduction 09/28/2017.  Patient has been doing reasonably well in the nursing home.  On examination the fracture moves as a unit but there has been recurrence of the proximal shoulder girdle deformity.  Judith Hunter pulse is intact.  Judith Hunter lacks 30 degrees of full extension.  Motor sensory function to the hand is intact.  Radiographs show loss of reduction fracture does however move as a unit.  I discussed this at length with the patient's son via telephone as well as Judith Hunter.  She is 83 years old and does not want surgery.  Particularly is not too fond of placement surgery for the shoulder.  There are risk and benefits involved in this including infection dislocation and poor bone quality for fixation.  Plan at this time is to see how she manages functionally with the left shoulder which will more than 30 or 40 degrees of abduction.  I think if she decides to maintain this course that will be fine.  But if she does not then I would rather do something sooner rather than later.  We will check her back in 3 weeks for clinical recheck and decision at that time for or against surgical intervention.  Follow-Up Instructions: Return in about 3 weeks (around 11/01/2017).   Orders:  Orders Placed This Encounter  Procedures  . XR Shoulder Left   No orders of the defined types were placed in this encounter.   Imaging: No results found.  PMFS History: Patient Active Problem List   Diagnosis Date Noted  . Pain   . Shoulder fracture, left  09/28/2017  . Closed fracture of head of left humerus 09/25/2017  . A-fib ([place]) 09/25/2017   Past Medical History:  Diagnosis Date  . A-fib ([place])   . Lymphoma ([place])    Remission for ~5 years now per patient (looks more like 11 years remission by [facility] onc notes)    History reviewed. No pertinent family history.  Past Surgical History:  Procedure Laterality Date  . ORIF HUMERUS FRACTURE Left 09/28/2017   Procedure: CLOSED REDUCTION LEFT PROXIMAL HUMERUS FRACTURE;  Surgeon: Meredith Pel, MD;  Location: [facility];  Service: Orthopedics;  Laterality: Left;   Social History   Occupational History  . Not on file  Tobacco Use  . Smoking status: Never Smoker  . Smokeless tobacco: Never Used  Substance and Sexual Activity  . Alcohol use: No    Frequency: Never  . Drug use: No  . Sexual activity: No

## 2017-11-03 ENCOUNTER — Ambulatory Visit (INDEPENDENT_AMBULATORY_CARE_PROVIDER_SITE_OTHER): Payer: No Typology Code available for payment source | Admitting: Orthopedic Surgery

## 2017-11-03 ENCOUNTER — Encounter (INDEPENDENT_AMBULATORY_CARE_PROVIDER_SITE_OTHER): Payer: Self-pay | Admitting: Orthopedic Surgery

## 2017-11-03 ENCOUNTER — Ambulatory Visit (INDEPENDENT_AMBULATORY_CARE_PROVIDER_SITE_OTHER): Payer: No Typology Code available for payment source

## 2017-11-03 DIAGNOSIS — S42292D Other displaced fracture of upper end of left humerus, subsequent encounter for fracture with routine healing: Secondary | ICD-10-CM

## 2017-11-05 ENCOUNTER — Encounter (INDEPENDENT_AMBULATORY_CARE_PROVIDER_SITE_OTHER): Payer: Self-pay | Admitting: Orthopedic Surgery

## 2017-11-05 NOTE — Progress Notes (Signed)
   Post-Op Visit Note   Patient: Judith Hunter           Date of Birth: 1924/01/07           MRN: [id number] Visit Date: 11/03/2017 PCP: Patient, No Pcp Per   Assessment & Plan:  Chief Complaint:  Chief Complaint  Patient presents with  . Left Shoulder - Follow-up   Visit Diagnoses:  1. Closed fracture of head of left humerus with routine healing, subsequent encounter     Plan: Rylea is a patient who underwent left proximal humerus fracture closed reduction a month ago.  She states she is doing some better but still reports pain in the shoulder.  She is in rehab.  On exam she has intact motor sensory function to the hand and her shoulder has about 30 degrees of forward flexion and abduction.  The fracture itself is moving as a unit.  Radiographs show significant displacement of the shaft in relation to the head.  This is shown to both the patient and her son.  No great options are present for this particular problem.  I do not think she will gain much more in terms of functional motion.  Her pain may or may not get better.  Surgical options would be reverse shoulder replacement which would be very dicey in this patient due to her poor bone quality.  Alternatively resection arthroplasty could be considered.  This really only if she has significant pain.  I think in general her pain is a little bit better.  We will see her back in 6 weeks in the sidebend if we need to proceed with any type of surgical intervention  Follow-Up Instructions: Return in about 6 weeks (around 12/15/2017).   Orders:  Orders Placed This Encounter  Procedures  . XR Shoulder Left   No orders of the defined types were placed in this encounter.   Imaging: No results found.  PMFS History: Patient Active Problem List   Diagnosis Date Noted  . Pain   . Shoulder fracture, left 09/28/2017  . Closed fracture of head of left humerus 09/25/2017  . A-fib ([place]) 09/25/2017   Past Medical History:  Diagnosis Date   . A-fib ([place])   . Lymphoma ([place])    Remission for ~5 years now per patient (looks more like 11 years remission by [facility] notes)    History reviewed. No pertinent family history.  Past Surgical History:  Procedure Laterality Date  . ORIF HUMERUS FRACTURE Left 09/28/2017   Procedure: CLOSED REDUCTION LEFT PROXIMAL HUMERUS FRACTURE;  Surgeon: Meredith Pel, MD;  Location: [place];  Service: Orthopedics;  Laterality: Left;   Social History   Occupational History  . Not on file  Tobacco Use  . Smoking status: Never Smoker  . Smokeless tobacco: Never Used  Substance and Sexual Activity  . Alcohol use: No    Frequency: Never  . Drug use: No  . Sexual activity: No

## 2017-12-16 ENCOUNTER — Ambulatory Visit (INDEPENDENT_AMBULATORY_CARE_PROVIDER_SITE_OTHER): Payer: Medicare Other | Admitting: Orthopedic Surgery

## 2017-12-30 ENCOUNTER — Ambulatory Visit (INDEPENDENT_AMBULATORY_CARE_PROVIDER_SITE_OTHER): Payer: Medicare Other | Admitting: Orthopedic Surgery

## 2018-01-10 ENCOUNTER — Ambulatory Visit (INDEPENDENT_AMBULATORY_CARE_PROVIDER_SITE_OTHER): Payer: Medicare Other | Admitting: Orthopedic Surgery

## 2018-01-27 ENCOUNTER — Other Ambulatory Visit: Payer: Self-pay

## 2018-01-27 ENCOUNTER — Inpatient Hospital Stay (HOSPITAL_COMMUNITY)
Admission: EM | Admit: 2018-01-27 | Discharge: 2018-02-08 | DRG: 871 | Disposition: A | Discharge status: Skilled Nursing Facility | Payer: Medicare Other | Attending: Internal Medicine | Admitting: Internal Medicine

## 2018-01-27 ENCOUNTER — Encounter (HOSPITAL_COMMUNITY): Payer: Self-pay | Admitting: *Deleted

## 2018-01-27 ENCOUNTER — Emergency Department (HOSPITAL_COMMUNITY): Payer: Medicare Other

## 2018-01-27 ENCOUNTER — Encounter (INDEPENDENT_AMBULATORY_CARE_PROVIDER_SITE_OTHER): Payer: Self-pay | Admitting: Orthopedic Surgery

## 2018-01-27 ENCOUNTER — Inpatient Hospital Stay (HOSPITAL_COMMUNITY): Payer: Medicare Other

## 2018-01-27 ENCOUNTER — Ambulatory Visit (INDEPENDENT_AMBULATORY_CARE_PROVIDER_SITE_OTHER): Payer: Medicare Other

## 2018-01-27 ENCOUNTER — Ambulatory Visit (INDEPENDENT_AMBULATORY_CARE_PROVIDER_SITE_OTHER): Payer: Medicare Other | Admitting: Orthopedic Surgery

## 2018-01-27 DIAGNOSIS — B370 Candidal stomatitis: Secondary | ICD-10-CM | POA: Diagnosis not present

## 2018-01-27 DIAGNOSIS — I361 Nonrheumatic tricuspid (valve) insufficiency: Secondary | ICD-10-CM | POA: Diagnosis not present

## 2018-01-27 DIAGNOSIS — A419 Sepsis, unspecified organism: Secondary | ICD-10-CM | POA: Diagnosis present

## 2018-01-27 DIAGNOSIS — M545 Low back pain: Secondary | ICD-10-CM | POA: Diagnosis not present

## 2018-01-27 DIAGNOSIS — Z7901 Long term (current) use of anticoagulants: Secondary | ICD-10-CM

## 2018-01-27 DIAGNOSIS — E876 Hypokalemia: Secondary | ICD-10-CM | POA: Diagnosis present

## 2018-01-27 DIAGNOSIS — Z66 Do not resuscitate: Secondary | ICD-10-CM | POA: Diagnosis not present

## 2018-01-27 DIAGNOSIS — R0902 Hypoxemia: Secondary | ICD-10-CM

## 2018-01-27 DIAGNOSIS — J189 Pneumonia, unspecified organism: Secondary | ICD-10-CM

## 2018-01-27 DIAGNOSIS — R0602 Shortness of breath: Secondary | ICD-10-CM | POA: Diagnosis present

## 2018-01-27 DIAGNOSIS — Z7189 Other specified counseling: Secondary | ICD-10-CM

## 2018-01-27 DIAGNOSIS — J181 Lobar pneumonia, unspecified organism: Secondary | ICD-10-CM | POA: Diagnosis present

## 2018-01-27 DIAGNOSIS — Z515 Encounter for palliative care: Secondary | ICD-10-CM

## 2018-01-27 DIAGNOSIS — I272 Pulmonary hypertension, unspecified: Secondary | ICD-10-CM | POA: Diagnosis present

## 2018-01-27 DIAGNOSIS — J9601 Acute respiratory failure with hypoxia: Secondary | ICD-10-CM

## 2018-01-27 DIAGNOSIS — I482 Chronic atrial fibrillation: Secondary | ICD-10-CM | POA: Diagnosis present

## 2018-01-27 DIAGNOSIS — I071 Rheumatic tricuspid insufficiency: Secondary | ICD-10-CM | POA: Diagnosis present

## 2018-01-27 DIAGNOSIS — R140 Abdominal distension (gaseous): Secondary | ICD-10-CM

## 2018-01-27 DIAGNOSIS — E43 Unspecified severe protein-calorie malnutrition: Secondary | ICD-10-CM | POA: Diagnosis present

## 2018-01-27 DIAGNOSIS — Z881 Allergy status to other antibiotic agents status: Secondary | ICD-10-CM | POA: Diagnosis not present

## 2018-01-27 DIAGNOSIS — J811 Chronic pulmonary edema: Secondary | ICD-10-CM

## 2018-01-27 DIAGNOSIS — I4891 Unspecified atrial fibrillation: Secondary | ICD-10-CM

## 2018-01-27 DIAGNOSIS — I5033 Acute on chronic diastolic (congestive) heart failure: Secondary | ICD-10-CM | POA: Diagnosis present

## 2018-01-27 DIAGNOSIS — J810 Acute pulmonary edema: Secondary | ICD-10-CM | POA: Diagnosis not present

## 2018-01-27 DIAGNOSIS — I351 Nonrheumatic aortic (valve) insufficiency: Secondary | ICD-10-CM | POA: Diagnosis not present

## 2018-01-27 LAB — BASIC METABOLIC PANEL
Anion gap: 15 (ref 5–15)
BUN: 11 mg/dL (ref 6–20)
CO2: 28 mmol/L (ref 22–32)
Calcium: 8.7 mg/dL — ABNORMAL LOW (ref 8.9–10.3)
Chloride: 91 mmol/L — ABNORMAL LOW (ref 101–111)
Creatinine, Ser: 0.71 mg/dL (ref 0.44–1.00)
GFR calc Af Amer: >60 mL/min (ref >60)
GFR calc non Af Amer: >60 mL/min (ref >60)
Glucose, Bld: 118 mg/dL — ABNORMAL HIGH (ref 65–99)
Potassium: 3.1 mmol/L — ABNORMAL LOW (ref 3.5–5.1)
Sodium: 134 mmol/L — ABNORMAL LOW (ref 135–145)

## 2018-01-27 LAB — HEPATIC FUNCTION PANEL
ALBUMIN: 2.8 g/dL — AB (ref 3.5–5.0)
ALT: 17 U/L (ref 14–54)
AST: 38 U/L (ref 15–41)
Alkaline Phosphatase: 245 U/L — ABNORMAL HIGH (ref 38–126)
Bilirubin, Direct: 0.4 mg/dL (ref 0.1–0.5)
Indirect Bilirubin: 0.7 mg/dL (ref 0.3–0.9)
TOTAL PROTEIN: 5.2 g/dL — AB (ref 6.5–8.1)
Total Bilirubin: 1.1 mg/dL (ref 0.3–1.2)

## 2018-01-27 LAB — PHOSPHORUS: PHOSPHORUS: 3.1 mg/dL (ref 2.5–4.6)

## 2018-01-27 LAB — CBC
HCT: 38.4 % (ref 36.0–46.0)
Hemoglobin: 12.9 g/dL (ref 12.0–15.0)
MCH: 31.4 pg (ref 26.0–34.0)
MCHC: 33.6 g/dL (ref 30.0–36.0)
MCV: 93.4 fL (ref 78.0–100.0)
Platelets: 145 10*3/uL — ABNORMAL LOW (ref 150–400)
RBC: 4.11 MIL/uL (ref 3.87–5.11)
RDW: 15.9 % — ABNORMAL HIGH (ref 11.5–15.5)
WBC: 5.9 10*3/uL (ref 4.0–10.5)

## 2018-01-27 LAB — LACTIC ACID, PLASMA
Lactic Acid, Venous: 0.9 mmol/L (ref 0.5–1.9)
Lactic Acid, Venous: 1 mmol/L (ref 0.5–1.9)

## 2018-01-27 LAB — I-STAT CG4 LACTIC ACID, ED
LACTIC ACID, VENOUS: 1.99 mmol/L — AB (ref 0.5–1.9)
Lactic Acid, Venous: 0.81 mmol/L (ref 0.5–1.9)

## 2018-01-27 LAB — I-STAT TROPONIN, ED: Troponin i, poc: 0.03 ng/mL (ref 0.00–0.08)

## 2018-01-27 LAB — BRAIN NATRIURETIC PEPTIDE
B NATRIURETIC PEPTIDE 5: 220.4 pg/mL — AB (ref 0.0–100.0)
B Natriuretic Peptide: 546.6 pg/mL — ABNORMAL HIGH (ref 0.0–100.0)

## 2018-01-27 LAB — MAGNESIUM: Magnesium: 1.6 mg/dL — ABNORMAL LOW (ref 1.7–2.4)

## 2018-01-27 LAB — PROTIME-INR
INR: 1.85
Prothrombin Time: 21.2 seconds — ABNORMAL HIGH (ref 11.4–15.2)

## 2018-01-27 MED ORDER — GUAIFENESIN ER 600 MG PO TB12
1200.0000 mg | ORAL_TABLET | Freq: Two times a day (BID) | ORAL | Status: DC
  Administered 2018-01-27 – 2018-02-08 (×24): 1200 mg via ORAL
  Filled 2018-01-27 (×25): qty 2

## 2018-01-27 MED ORDER — SODIUM CHLORIDE 0.9 % IV BOLUS
500.0000 mL | Freq: Once | INTRAVENOUS | Status: AC
  Administered 2018-01-27: 500 mL via INTRAVENOUS

## 2018-01-27 MED ORDER — SODIUM CHLORIDE 0.9 % IV SOLN
1.0000 g | INTRAVENOUS | Status: DC
  Administered 2018-01-28 – 2018-02-04 (×8): 1 g via INTRAVENOUS
  Filled 2018-01-27 (×8): qty 10

## 2018-01-27 MED ORDER — WARFARIN SODIUM 2 MG PO TABS
2.0000 mg | ORAL_TABLET | Freq: Once | ORAL | Status: AC
  Administered 2018-01-27: 2 mg via ORAL
  Filled 2018-01-27: qty 1

## 2018-01-27 MED ORDER — POTASSIUM CHLORIDE 10 MEQ/100ML IV SOLN
10.0000 meq | Freq: Once | INTRAVENOUS | Status: AC
  Administered 2018-01-27: 10 meq via INTRAVENOUS

## 2018-01-27 MED ORDER — POTASSIUM CHLORIDE 20 MEQ/15ML (10%) PO SOLN
40.0000 meq | Freq: Once | ORAL | Status: AC
  Administered 2018-01-27: 40 meq via ORAL
  Filled 2018-01-27: qty 30

## 2018-01-27 MED ORDER — SODIUM CHLORIDE 0.9 % IV BOLUS
500.0000 mL | Freq: Once | INTRAVENOUS | Status: AC
Start: 2018-01-27 — End: 2018-01-27
  Administered 2018-01-27: 500 mL via INTRAVENOUS

## 2018-01-27 MED ORDER — DOXYCYCLINE HYCLATE 100 MG PO TABS
100.0000 mg | ORAL_TABLET | Freq: Two times a day (BID) | ORAL | Status: DC
  Administered 2018-01-27 – 2018-02-04 (×16): 100 mg via ORAL
  Filled 2018-01-27 (×16): qty 1

## 2018-01-27 MED ORDER — SODIUM CHLORIDE 0.9 % IV SOLN
INTRAVENOUS | Status: DC
  Administered 2018-01-27 (×2): via INTRAVENOUS

## 2018-01-27 MED ORDER — SODIUM CHLORIDE 0.9 % IV SOLN
1.0000 g | Freq: Once | INTRAVENOUS | Status: AC
  Administered 2018-01-27: 1 g via INTRAVENOUS
  Filled 2018-01-27: qty 10

## 2018-01-27 MED ORDER — DOXYCYCLINE HYCLATE 100 MG PO TABS
100.0000 mg | ORAL_TABLET | Freq: Once | ORAL | Status: AC
  Administered 2018-01-27: 100 mg via ORAL
  Filled 2018-01-27: qty 1

## 2018-01-27 MED ORDER — POTASSIUM CHLORIDE 10 MEQ/100ML IV SOLN
10.0000 meq | INTRAVENOUS | Status: AC
  Administered 2018-01-27: 10 meq via INTRAVENOUS
  Filled 2018-01-27 (×2): qty 100

## 2018-01-27 MED ORDER — METOPROLOL SUCCINATE ER 50 MG PO TB24
50.0000 mg | ORAL_TABLET | Freq: Two times a day (BID) | ORAL | Status: DC
  Administered 2018-01-27 – 2018-01-30 (×7): 50 mg via ORAL
  Filled 2018-01-27 (×8): qty 1

## 2018-01-27 MED ORDER — WARFARIN - PHARMACIST DOSING INPATIENT
Freq: Every day | Status: DC
  Administered 2018-01-28 – 2018-02-01 (×2)

## 2018-01-27 MED ORDER — ALBUTEROL SULFATE (2.5 MG/3ML) 0.083% IN NEBU
2.5000 mg | INHALATION_SOLUTION | Freq: Once | RESPIRATORY_TRACT | Status: AC
  Administered 2018-01-27: 2.5 mg via RESPIRATORY_TRACT
  Filled 2018-01-27: qty 3

## 2018-01-27 NOTE — ED Notes (Signed)
Nurse from Dr. Randel Pigg office called and spoke with this RN stating pt was being sent over from orthopedics office for SOB. Pt was there for routine follow-up and endorsed SOB and general decline in health.

## 2018-01-27 NOTE — ED Notes (Signed)
Got patient on the monitor and a warm blanket patient is resting with family at bedside

## 2018-01-27 NOTE — ED Notes (Signed)
Patient transported to X-ray 

## 2018-01-27 NOTE — Progress Notes (Signed)
ANTICOAGULATION CONSULT NOTE - Initial Consult  Pharmacy Consult for coumadin Indication: atrial fibrillation  Allergies  Allergen Reactions  . Azithromycin Other (See Comments)    unknown  . Ciprofloxacin     Felt like her head was on fire    Patient Measurements: Height: 5\' 6"  (167.6 cm) Weight: 119 lb (54 kg) IBW/kg (Calculated) : 59.3  Vital Signs: Temp: 98.6 F (37 C) (04/04 1044) Temp Source: Oral (04/04 1044) BP: 147/80 (04/04 1747) Pulse Rate: 83 (04/04 1747)  Labs: Recent Labs    01/27/18 1040 01/27/18 1250  HGB 12.9  --   HCT 38.4  --   PLT 145*  --   LABPROT  --  21.2*  INR  --  1.85  CREATININE 0.71  --     Estimated Creatinine Clearance: 39 mL/min (by C-G formula based on SCr of 0.71 mg/dL).   Medical History: Past Medical History:  Diagnosis Date  . A-fib ([place])   . Lymphoma ([place])    Remission for ~5 years now per patient (looks more like 11 years remission by [facility] notes)    Assessment: 89 YOF on coumadin PTA for afib, presented to the ED with SOB and started on rocephin and doxycycline. Pharmacy is also consulted to manage coumadin dosing. INR 1.85 today. CBC wnl.  Family is not available to complete med history. Pt was on 2mg  daily in December, and Per patient she hasn't taken today's dose yet.  Goal of Therapy:  INR 2-3 Monitor platelets by anticoagulation protocol: Yes   Plan:  - Coumadin 2mg  tonight  - f/u daily INR - Might consider reducing dose in anticipation of DDI with doxy  Maryanna Shape, PharmD, BCPS  Clinical Pharmacist  Pager: [phone]  01/27/2018,5:57 PM

## 2018-01-27 NOTE — Progress Notes (Signed)
ANTIBIOTIC CONSULT NOTE - INITIAL  Pharmacy Consult for ceftriaxone  Indication: pneumonia  Allergies  Allergen Reactions  . Azithromycin Other (See Comments)    unknown  . Ciprofloxacin     Felt like her head was on fire    Patient Measurements: Weight: 120 lb (54.4 kg) Adjusted Body Weight: 54.4kg  Vital Signs: Temp: 98.6 F (37 C) (04/04 1044) Temp Source: Oral (04/04 1044) BP: 160/86 (04/04 1215) Pulse Rate: 102 (04/04 1215) Intake/Output from previous day: No intake/output data recorded. Intake/Output from this shift: No intake/output data recorded.  Labs: Recent Labs    01/27/18 1040  WBC 5.9  HGB 12.9  PLT 145*  CREATININE 0.71   Estimated Creatinine Clearance: 39.3 mL/min (by C-G formula based on SCr of 0.71 mg/dL). No results for input(s): VANCOTROUGH, VANCOPEAK, VANCORANDOM, GENTTROUGH, GENTPEAK, GENTRANDOM, TOBRATROUGH, TOBRAPEAK, TOBRARND, AMIKACINPEAK, AMIKACINTROU, AMIKACIN in the last 72 hours.   Microbiology: No results found for this or any previous visit (from the past 720 hour(s)).  Medical History: Past Medical History:  Diagnosis Date  . A-fib ([place])   . Lymphoma ([place])    Remission for ~5 years now per patient (looks more like 11 years remission by [facility] notes)     Assessment: 84 yo female who presents to ED with SOA. Pharmacy consulted for ceftriaxone for CAP, afebrile, HR 112.  WBC 5.9.    Plan:  Ceftriaxone 1gm IV q24h Pharmacy will sign off  Fermina Mishkin A. Levada Dy, PharmD, [place] Pager: [phone]  01/27/2018,12:46 PM

## 2018-01-27 NOTE — Progress Notes (Signed)
NURSING PROGRESS NOTE  Judith Hunter [id number] Admission Data: 01/27/2018 8:37 PM Attending Provider: Elwin Mocha, MD [id number], No Pcp Per Code Status: full    Judith Hunter is a 84 y.o. female patient admitted from ED:  -No acute distress noted.  -No complaints of shortness of breath.  -No complaints of chest pain.   Cardiac Monitoring: Box # 36 in place. Cardiac monitor yields:atrial fibrillation, with ventricular rate of 83.  Blood pressure (!) 147/80, pulse 83, temperature 98.6 F (37 C), temperature source Oral, resp. rate (!) 29, height 5\' 6"  (1.676 m), weight 54 kg (119 lb), SpO2 95 %.   IV Fluids:  IV in place, occlusive dsg intact without redness, IV cath antecubital right, condition patent and no redness normal saline.   Allergies:  Azithromycin and Ciprofloxacin  Past Medical History:   has a past medical history of A-fib ([place]) and Lymphoma ([place]).  Past Surgical History:   has a past surgical history that includes ORIF humerus fracture (Left, 09/28/2017).  Social History:   reports that she has never smoked. She has never used smokeless tobacco. She reports that she does not drink alcohol or use drugs.  Skin: redness on buttocks, non blanchable, redness on bilateral heals blanchable.   Patient/Family orientated to room. Information packet given to patient/family. Admission inpatient armband information verified with patient/family to include name and date of birth and placed on patient arm. Side rails up x 2, fall assessment and education completed with patient/family. Patient/family able to verbalize understanding of risk associated with falls and verbalized understanding to call for assistance before getting out of bed. Call light within reach. Patient/family able to voice and demonstrate understanding of unit orientation instructions.    Will continue to evaluate and treat per MD orders.

## 2018-01-27 NOTE — H&P (Signed)
[facility] History and Physical  Judith Hunter PJA:[id number] DOB: [birth year] DOA: 01/27/2018  Referring physician:  PCP: Patient, No Pcp Per   Chief Complaint: "Dr. Marlou Sa was concerned because I sound congested."  HPI: Judith Hunter is a 84 y.o. female with past medical history significant for atrial fibrillation, lymphoma presents emergency room with shortness of breath.  Patient went to see Dr. Marlou Sa her orthopedist for her issues with back pain.  On his assessment the patient was congested and short of breath so he had patient transported to the emergency room by ambulance.  Patient states she has felt a little short of breath for the last "few" days.  She is also had less oral intake over the last few days as well.  Patient denies any sick contacts.  No fevers or chills.  Denies any other symptoms.  ED course: Chest x-ray shows multilobular pneumonia.  Patient started on doxycycline and Rocephin.  Found to have sepsis.  Hospitalist consulted for admission.   Review of Systems:  As per HPI otherwise 10 point review of systems negative.    Past Medical History:  Diagnosis Date  . A-fib ([place])   . Lymphoma ([place])    Remission for ~5 years now per patient (looks more like 11 years remission by [facility] notes)   Past Surgical History:  Procedure Laterality Date  . ORIF HUMERUS FRACTURE Left 09/28/2017   Procedure: CLOSED REDUCTION LEFT PROXIMAL HUMERUS FRACTURE;  Surgeon: Meredith Pel, MD;  Location: [place];  Service: Orthopedics;  Laterality: Left;   Social History:  reports that she has never smoked. She has never used smokeless tobacco. She reports that she does not drink alcohol or use drugs.  Allergies  Allergen Reactions  . Azithromycin Other (See Comments)    unknown  . Ciprofloxacin     Felt like her head was on fire    History reviewed. No pertinent family history.   Prior to Admission medications   Medication Sig Start Date End Date Taking? Authorizing  Provider  COUMADIN 2 MG tablet Take Coumadin 0.5 mg today on 12/6, recheck PT/INR on 12/7.  Patient may resume her maintenance dose of 2 mg daily on 12/7 if INR is between 2 and 3 09/30/17   Rai, Ripudeep K, MD  furosemide (LASIX) 20 MG tablet Take 80 mg by mouth daily.     [provider]  HYDROcodone-acetaminophen (NORCO/VICODIN) 5-325 MG tablet Take 1 tablet by mouth every 6 (six) hours as needed for moderate pain or severe pain. 09/30/17   Rai, Vernelle Emerald, MD  polyethylene glycol (MIRALAX / GLYCOLAX) packet Take 17 g by mouth daily as needed for severe constipation. 09/30/17   Rai, Ripudeep K, MD  senna-docusate (SENOKOT-S) 8.6-50 MG tablet Take 1 tablet by mouth at bedtime. 09/30/17   Rai, Ripudeep K, MD  TOPROL XL 100 MG 24 hr tablet Take 50 mg by mouth 2 (two) times daily. 07/30/17   [provider]   Physical Exam: Vitals:   01/27/18 1400 01/27/18 1430 01/27/18 1500 01/27/18 1530  BP: 139/72 (!) 153/90 (!) 155/79 (!) 156/74  Pulse: 91 92 81 (!) 101  Resp: (!) 25 13 16 20   Temp:      TempSrc:      SpO2: 94% 96% 96% 94%  Weight:        Wt Readings from Last 3 Encounters:  01/27/18 54.4 kg (120 lb)  09/24/17 54.4 kg (120 lb)    General:  Appears calm and comfortable;  A&Ox3 Eyes:  PERRL, EOMI, normal lids, iris ENT:  grossly normal hearing, lips & tongue Neck:  no LAD, masses or thyromegaly Cardiovascular:  RRR, no m/r/g. No LE edema.  Respiratory:  CTA bilaterally, no w/r/r. Normal respiratory effort. Abdomen:  soft, ntnd Skin:  no rash or induration seen on limited exam Musculoskeletal:  grossly normal tone BUE/BLE Psychiatric:  grossly normal mood and affect, speech fluent and appropriate Neurologic:  CN 2-12 grossly intact, moves all extremities in coordinated fashion.          Labs on Admission:  Basic Metabolic Panel: Recent Labs  Lab 01/27/18 1040  NA 134*  K 3.1*  CL 91*  CO2 28  GLUCOSE 118*  BUN 11  CREATININE 0.71  CALCIUM 8.7*   Liver  Function Tests: No results for input(s): AST, ALT, ALKPHOS, BILITOT, PROT, ALBUMIN in the last 168 hours. No results for input(s): LIPASE, AMYLASE in the last 168 hours. No results for input(s): AMMONIA in the last 168 hours. CBC: Recent Labs  Lab 01/27/18 1040  WBC 5.9  HGB 12.9  HCT 38.4  MCV 93.4  PLT 145*   Cardiac Enzymes: No results for input(s): CKTOTAL, CKMB, CKMBINDEX, TROPONINI in the last 168 hours.  BNP (last 3 results) Recent Labs    09/25/17 0052 01/27/18 1250  BNP 132.0* 220.4*    ProBNP (last 3 results) No results for input(s): PROBNP in the last 8760 hours.   Serum creatinine: 0.71 mg/dL 01/27/18 1040 Estimated creatinine clearance: 39.3 mL/min  CBG: No results for input(s): GLUCAP in the last 168 hours.  Radiological Exams on Admission: Dg Chest Portable 1 View  Result Date: 01/27/2018 CLINICAL DATA:  Worsening shortness of breath. History of atrial fibrillation. EXAM: PORTABLE CHEST 1 VIEW COMPARISON:  PA and lateral chest 09/25/2017. FINDINGS: There is marked cardiomegaly. Distortion of the pulmonary architecture is seen and there is coarsening of the pulmonary interstitium most conspicuous in the left mid and lower lung zones. Airspace opacity is seen in the right upper lobe and there is coarsening of the interstitium in the left mid and lower lung zones. Small bilateral pleural effusions are seen. Aortic atherosclerosis is noted. No pneumothorax. 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 IMPRESSION: Right upper lobe airspace opacity worrisome for pneumonia. Interstitial opacities in the left mid and lower lung zones could also be due to pneumonia. Marked cardiomegaly without edema. Small bilateral pleural effusions. Atherosclerosis. Electronically Signed   By: Inge Rise M.D.   On: 01/27/2018 11:12    EKG: Independently reviewed. Afib with RVR.  Assessment/Plan Active Problems:   Sepsis due to pneumonia ([place])   Sepsis 2/2 pna Patient  hemodynamically stable Given rocephin emergency room, will continue Given doxy in the emergency room, will continue Urine culture pending Blood cultures 2 pending Patient given 500 mL of fluid in the emergency room Lactic acid elevated Scheduled DuoNeb's When necessary albuterol Oxygen therapy Continuous pulse oximetry  Low K Replace and recheck  Afib Cont warfarin and toprol-XL  Code Status: FC  DVT Prophylaxis: coumadin Family Communication: none availble Disposition Plan: Pending Improvement  Status: inpt, tele  Elwin Mocha, MD Family Medicine [facility] [email or web address] Password [email or web address]

## 2018-01-27 NOTE — ED Notes (Signed)
Pt placed on 2L in triage, O2 level increased to 94%, pt to be placed in next room

## 2018-01-27 NOTE — ED Provider Notes (Signed)
[facility] EMERGENCY DEPARTMENT Provider Note   CSN: [id number] Arrival date & time: 01/27/18  1028     History   Chief Complaint Chief Complaint  Patient presents with  . Shortness of Breath    HPI Judith Hunter is a 84 y.o. female.  HPI  84 year old female presents with dyspnea.  History is taken from patient and family.  The patient has had a gradual decline since having rotator cuff surgery and going into rehab and then going home.  This is starting back in November.  She has had progressive shortness of breath but much more shortness of breath over the last 24 hours or so.  Has had a cough and cold-like symptoms over the last day or so.  Low-grade temperature of 99.  Patient normally does not wear oxygen.  She went to her orthopedist today where she was noted to have compression fractures of her lumbar spine.  She was sent here because of the shortness of breath. Has had progressive worsening appetite. Has become progressively weaker.  Has chronic leg swelling, right greater than left but at this point her legs seem okay to family.  Past Medical History:  Diagnosis Date  . A-fib ([place])   . Lymphoma ([place])    Remission for ~5 years now per patient (looks more like 11 years remission by [facility] notes)    Patient Active Problem List   Diagnosis Date Noted  . Pain   . Shoulder fracture, left 09/28/2017  . Closed fracture of head of left humerus 09/25/2017  . A-fib ([place]) 09/25/2017    Past Surgical History:  Procedure Laterality Date  . ORIF HUMERUS FRACTURE Left 09/28/2017   Procedure: CLOSED REDUCTION LEFT PROXIMAL HUMERUS FRACTURE;  Surgeon: Meredith Pel, MD;  Location: [facility];  Service: Orthopedics;  Laterality: Left;     OB History   None      Home Medications    Prior to Admission medications   Medication Sig Start Date End Date Taking? Authorizing Provider  COUMADIN 2 MG tablet Take Coumadin 0.5 mg today on 12/6, recheck PT/INR on  12/7.  Patient may resume her maintenance dose of 2 mg daily on 12/7 if INR is between 2 and 3 09/30/17   Rai, Ripudeep K, MD  furosemide (LASIX) 20 MG tablet Take 20-40 mg by mouth See admin instructions. Alternate 20 mg and 40 mg every other day    [provider]  HYDROcodone-acetaminophen (NORCO/VICODIN) 5-325 MG tablet Take 1 tablet by mouth every 6 (six) hours as needed for moderate pain or severe pain. 09/30/17   Rai, Vernelle Emerald, MD  polyethylene glycol (MIRALAX / GLYCOLAX) packet Take 17 g by mouth daily as needed for severe constipation. 09/30/17   Rai, Ripudeep K, MD  senna-docusate (SENOKOT-S) 8.6-50 MG tablet Take 1 tablet by mouth at bedtime. 09/30/17   Rai, Ripudeep K, MD  TOPROL XL 100 MG 24 hr tablet Take 50 mg by mouth 2 (two) times daily. 07/30/17   [provider]    Family History History reviewed. No pertinent family history.  Social History Social History   Tobacco Use  . Smoking status: Never Smoker  . Smokeless tobacco: Never Used  Substance Use Topics  . Alcohol use: No    Frequency: Never  . Drug use: No     Allergies   Azithromycin and Ciprofloxacin   Review of Systems Review of Systems  Constitutional: Negative for fever.  HENT: Positive for congestion.  Respiratory: Positive for cough and shortness of breath.   Cardiovascular: Positive for leg swelling. Negative for chest pain.  Neurological: Positive for weakness.  All other systems reviewed and are negative.    Physical Exam Updated Vital Signs BP (!) 155/70 (BP Location: Right Arm)   Pulse 86   Temp 98.6 F (37 C) (Oral)   Resp (!) 22   Wt 54.4 kg (120 lb)   SpO2 96%   BMI 19.37 kg/m   Physical Exam  Constitutional: She is oriented to person, place, and time. She appears well-developed. No distress.  Frail, chronically ill appearing  HENT:  Head: Normocephalic and atraumatic.  Right Ear: External ear normal.  Left Ear: External ear normal.  Nose: Nose normal.    Mouth/Throat: Mucous membranes are dry.  Eyes: Right eye exhibits no discharge. Left eye exhibits no discharge.  Cardiovascular: Normal rate and normal heart sounds. An irregular rhythm present.  Pulmonary/Chest: Effort normal. No accessory muscle usage. Tachypnea noted. She has decreased breath sounds. She has wheezes. She has rales.  Abdominal: Soft. There is no tenderness.  Musculoskeletal:       Right lower leg: She exhibits edema (moderate pitting edema).       Left lower leg: She exhibits edema (trace pitting edema).  Neurological: She is alert and oriented to person, place, and time.  Skin: Skin is warm and dry.  Nursing note and vitals reviewed.    ED Treatments / Results  Labs (all labs ordered are listed, but only abnormal results are displayed) Labs Reviewed  BASIC METABOLIC PANEL - Abnormal; Notable for the following components:      Result Value   Sodium 134 (*)    Potassium 3.1 (*)    Chloride 91 (*)    Glucose, Bld 118 (*)    Calcium 8.7 (*)    All other components within normal limits  CBC - Abnormal; Notable for the following components:   RDW 15.9 (*)    Platelets 145 (*)    All other components within normal limits  I-STAT TROPONIN, ED    EKG EKG Interpretation  Date/Time:  Thursday January 27 2018 10:39:41 EDT Ventricular Rate:  104 PR Interval:    QRS Duration: 88 QT Interval:  330 QTC Calculation: 433 R Axis:   -119 Text Interpretation:   Suspect arm lead reversal, interpretation assumes no reversal Atrial fibrillation with rapid ventricular response Lateral infarct , age undetermined ST & T wave abnormality, consider inferior ischemia Abnormal ECG no significant change compared to Dec 2018 Confirmed by Sherwood Gambler [phone]) on 01/27/2018 12:04:27 PM   Radiology Dg Chest Portable 1 View  Result Date: 01/27/2018 CLINICAL DATA:  Worsening shortness of breath. History of atrial fibrillation. EXAM: PORTABLE CHEST 1 VIEW COMPARISON:  PA and lateral  chest 09/25/2017. FINDINGS: There is marked cardiomegaly. Distortion of the pulmonary architecture is seen and there is coarsening of the pulmonary interstitium most conspicuous in the left mid and lower lung zones. Airspace opacity is seen in the right upper lobe and there is coarsening of the interstitium in the left mid and lower lung zones. Small bilateral pleural effusions are seen. Aortic atherosclerosis is noted. No pneumothorax. 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 IMPRESSION: Right upper lobe airspace opacity worrisome for pneumonia. Interstitial opacities in the left mid and lower lung zones could also be due to pneumonia. Marked cardiomegaly without edema. Small bilateral pleural effusions. Atherosclerosis. Electronically Signed   By: Inge Rise M.D.   On:  01/27/2018 11:12    Procedures Procedures (including critical care time)  Medications Ordered in ED Medications  cefTRIAXone (ROCEPHIN) 1 g in sodium chloride 0.9 % 100 mL IVPB (has no administration in time range)  doxycycline (VIBRA-TABS) tablet 100 mg (has no administration in time range)     Initial Impression / Assessment and Plan / ED Course  I have reviewed the triage vital signs and the nursing notes.  Pertinent labs & imaging results that were available during my care of the patient were reviewed by me and considered in my medical decision making (see chart for details).     Patient has hypoxic but this improves with nasal cannula oxygen.  This appears to be coming from community acquired pneumonia of the right upper lung.  Possibly some mild edema.  She also appears dry and was given a small bolus of fluids.  She was given Rocephin and doxycycline to help cover for community acquired organisms given her azithromycin allergy.  She appears stable for a hospitalist admit.  Final Clinical Impressions(s) / ED Diagnoses   Final diagnoses:  Community acquired pneumonia of right upper lobe of lung [facility])     ED Discharge Orders    None       Sherwood Gambler, MD 01/27/18 2231

## 2018-01-27 NOTE — ED Triage Notes (Signed)
Pt in with family c/o increased shortness of breath in the last day, pt has had some trouble breathing since coming home from rehab 6 weeks ago, but symptoms worsened in the last 24 hours, pt speaking in short phrases- family also reports that they went to her orthopedic MD today for follow up and he told them she has multiple lumbar compression fractures and that he was concerned about her abdomen as well

## 2018-01-28 DIAGNOSIS — J181 Lobar pneumonia, unspecified organism: Secondary | ICD-10-CM

## 2018-01-28 LAB — CBC WITH DIFFERENTIAL/PLATELET
BASOS PCT: 0 %
Basophils Absolute: 0 10*3/uL (ref 0.0–0.1)
EOS ABS: 0 10*3/uL (ref 0.0–0.7)
Eosinophils Relative: 0 %
HCT: 36 % (ref 36.0–46.0)
Hemoglobin: 11.6 g/dL — ABNORMAL LOW (ref 12.0–15.0)
LYMPHS ABS: 0.6 10*3/uL — AB (ref 0.7–4.0)
Lymphocytes Relative: 14 %
MCH: 31.1 pg (ref 26.0–34.0)
MCHC: 32.2 g/dL (ref 30.0–36.0)
MCV: 96.5 fL (ref 78.0–100.0)
MONO ABS: 0.4 10*3/uL (ref 0.1–1.0)
MONOS PCT: 8 %
NEUTROS PCT: 78 %
Neutro Abs: 3.3 10*3/uL (ref 1.7–7.7)
Platelets: 111 10*3/uL — ABNORMAL LOW (ref 150–400)
RBC: 3.73 MIL/uL — ABNORMAL LOW (ref 3.87–5.11)
RDW: 16.5 % — AB (ref 11.5–15.5)
WBC: 4.3 10*3/uL (ref 4.0–10.5)

## 2018-01-28 LAB — BASIC METABOLIC PANEL
Anion gap: 7 (ref 5–15)
BUN: 12 mg/dL (ref 6–20)
CALCIUM: 8.6 mg/dL — AB (ref 8.9–10.3)
CHLORIDE: 97 mmol/L — AB (ref 101–111)
CO2: 30 mmol/L (ref 22–32)
CREATININE: 0.57 mg/dL (ref 0.44–1.00)
GFR calc non Af Amer: >60 mL/min (ref >60)
GLUCOSE: 93 mg/dL (ref 65–99)
Potassium: 3.7 mmol/L (ref 3.5–5.1)
Sodium: 134 mmol/L — ABNORMAL LOW (ref 135–145)

## 2018-01-28 LAB — PROTIME-INR
INR: 2.01
Prothrombin Time: 22.6 seconds — ABNORMAL HIGH (ref 11.4–15.2)

## 2018-01-28 LAB — STREP PNEUMONIAE URINARY ANTIGEN: Strep Pneumo Urinary Antigen: NEGATIVE

## 2018-01-28 LAB — HIV ANTIBODY (ROUTINE TESTING W REFLEX): HIV SCREEN 4TH GENERATION: NONREACTIVE

## 2018-01-28 MED ORDER — LORAZEPAM 0.5 MG PO TABS
0.5000 mg | ORAL_TABLET | Freq: Once | ORAL | Status: DC
  Filled 2018-01-28: qty 1

## 2018-01-28 MED ORDER — PATIENT'S GUIDE TO USING COUMADIN BOOK
Freq: Once | Status: AC
  Administered 2018-01-28: 12:00:00
  Filled 2018-01-28: qty 1

## 2018-01-28 MED ORDER — IPRATROPIUM-ALBUTEROL 0.5-2.5 (3) MG/3ML IN SOLN
3.0000 mL | RESPIRATORY_TRACT | Status: DC | PRN
  Administered 2018-01-28: 3 mL via RESPIRATORY_TRACT
  Filled 2018-01-28: qty 3

## 2018-01-28 MED ORDER — FUROSEMIDE 10 MG/ML IJ SOLN
20.0000 mg | Freq: Once | INTRAMUSCULAR | Status: AC
  Administered 2018-01-28: 20 mg via INTRAVENOUS
  Filled 2018-01-28: qty 2

## 2018-01-28 MED ORDER — ACETAMINOPHEN 325 MG PO TABS
650.0000 mg | ORAL_TABLET | Freq: Four times a day (QID) | ORAL | Status: DC | PRN
  Administered 2018-01-28 – 2018-02-07 (×9): 650 mg via ORAL
  Filled 2018-01-28 (×9): qty 2

## 2018-01-28 MED ORDER — WARFARIN SODIUM 2 MG PO TABS
2.0000 mg | ORAL_TABLET | Freq: Once | ORAL | Status: AC
  Administered 2018-01-28: 2 mg via ORAL
  Filled 2018-01-28: qty 1

## 2018-01-28 NOTE — Progress Notes (Signed)
ANTICOAGULATION CONSULT NOTE - Follow Up Consult  Pharmacy Consult for coumadin Indication: atrial fibrillation  Allergies  Allergen Reactions  . Azithromycin Other (See Comments)    unknown  . Ciprofloxacin     Felt like her head was on fire   Patient Measurements: Height: 5\' 6"  (167.6 cm) Weight: 117 lb (53.1 kg) IBW/kg (Calculated) : 59.3  Vital Signs: Temp: 98 F (36.7 C) (04/05 0500) Temp Source: Oral (04/05 0500) BP: 108/55 (04/05 0500) Pulse Rate: 87 (04/05 0500)  Labs: Recent Labs    01/27/18 1040 01/27/18 1250 01/28/18 0657  HGB 12.9  --  11.6*  HCT 38.4  --  36.0  PLT 145*  --  PENDING  LABPROT  --  21.2* 22.6*  INR  --  1.85 2.01  CREATININE 0.71  --  0.57   Estimated Creatinine Clearance: 38.4 mL/min (by C-G formula based on SCr of 0.57 mg/dL).  Medical History: Past Medical History:  Diagnosis Date  . A-fib ([place])   . Lymphoma ([place])    Remission for ~5 years now per patient (looks more like 11 years remission by [facility] notes)   Assessment: 89 YOF on coumadin PTA for afib, presented to the ED with SOB and started on rocephin and doxycycline. Pharmacy is following for warfarin dosing. INR 2.0 today after receiving 2mg  dose yesterday. H/H stable, some thrombocytopenia with plt. 111K.  No noted bleeding complications.  Home Regimen:  Warfarin 1.5mg  daily   Goal of Therapy:  INR 2-3 Monitor platelets by anticoagulation protocol: Yes   Plan:  - Coumadin 2mg  tonight  - f/u daily INR  Rober Minion, PharmD., MS Clinical Pharmacist Pager:  [phone] Thank you for allowing pharmacy to be part of this patients care team. 01/28/2018,10:08 AM

## 2018-01-28 NOTE — Progress Notes (Signed)
PROGRESS NOTE  Judith Hunter FTD:[id number] DOB: April 10, 1924 DOA: 01/27/2018 PCP: Patient, No Pcp Per   LOS: 1 day   Brief Narrative / Interim history: 84 year old female with history of A. fib, lymphoma, presents to the emergency room with shortness of breath.  She was seen yesterday in her orthopedist office for back pain, she sounded congested and short of breath so she was sent to the ED.  Chest x-ray in the ED showed multilobar pneumonia, she was started on ceftriaxone and doxycycline and was admitted to the hospital  Assessment & Plan: Active Problems:   Sepsis due to pneumonia ([place])  Sepsis due to pneumonia -Started on ceftriaxone and doxycycline, continue -Cultures are pending -Duo nebs as needed, guaifenesin, add I-S  Acute hypoxic respiratory failure -In the setting of multifocal pneumonia, continue antibiotics as above, currently on 3 L nasal cannula, wean off oxygen as tolerated  Hypokalemia -improved after repletion, continue to monitor   Atrial fibrillation - patient's CHA2DS2-VASc Score for Stroke Risk is > 2, continue rate control with metoprolol as well as anticoagulation with Coumadin   DVT prophylaxis: On Coumadin Code Status: Full code, confirmed with patient Family Communication: No family at bedside Disposition Plan: Home when ready  Consultants:   None   Procedures:   None   Antimicrobials:  Ceftriaxone 4/4 >>  Doxycycline 4/4 >>    Subjective: - no chest pain, shortness of breath, no abdominal pain, nausea or vomiting. Feeling better this morning.   Objective: Vitals:   01/27/18 2146 01/28/18 0106 01/28/18 0454 01/28/18 0500  BP: (!) 144/75   (!) 108/55  Pulse: 92   87  Resp: (!) 22   18  Temp: 100.3 F (37.9 C)   98 F (36.7 C)  TempSrc: Oral   Oral  SpO2: 94% 96%  97%  Weight:   53.1 kg (117 lb)   Height:       No intake or output data in the 24 hours ending 01/28/18 1213 Filed Weights   01/27/18 1210 01/27/18 1735 01/28/18  0454  Weight: 54.4 kg (120 lb) 54 kg (119 lb) 53.1 kg (117 lb)    Examination:  Constitutional: NAD Eyes: PERRL, lids and conjunctivae normal ENMT: Mucous membranes are moist Respiratory: Coarse/gurgling breath sounds bilaterally, no wheezing. Normal respiratory effort. No accessory muscle use.  Cardiovascular: Regular rate and rhythm, no murmurs / rubs / gallops. No LE edema.  Abdomen: no tenderness. Bowel sounds positive.  Musculoskeletal: Decreased muscle mass Skin: no rashes Neurologic: non focal    Data Reviewed: I have independently reviewed following labs and imaging studies   CBC: Recent Labs  Lab 01/27/18 1040 01/28/18 0657  WBC 5.9 4.3  NEUTROABS  --  3.3  HGB 12.9 11.6*  HCT 38.4 36.0  MCV 93.4 96.5  PLT 145* 062*   Basic Metabolic Panel: Recent Labs  Lab 01/27/18 1040 01/27/18 1728 01/28/18 0657  NA 134*  --  134*  K 3.1*  --  3.7  CL 91*  --  97*  CO2 28  --  30  GLUCOSE 118*  --  93  BUN 11  --  12  CREATININE 0.71  --  0.57  CALCIUM 8.7*  --  8.6*  MG  --  1.6*  --   PHOS  --  3.1  --    GFR: Estimated Creatinine Clearance: 38.4 mL/min (by C-G formula based on SCr of 0.57 mg/dL). Liver Function Tests: Recent Labs  Lab 01/27/18 1728  AST 38  ALT 17  ALKPHOS 245*  BILITOT 1.1  PROT 5.2*  ALBUMIN 2.8*   No results for input(s): LIPASE, AMYLASE in the last 168 hours. No results for input(s): AMMONIA in the last 168 hours. Coagulation Profile: Recent Labs  Lab 01/27/18 1250 01/28/18 0657  INR 1.85 2.01   Cardiac Enzymes: No results for input(s): CKTOTAL, CKMB, CKMBINDEX, TROPONINI in the last 168 hours. BNP (last 3 results) No results for input(s): PROBNP in the last 8760 hours. HbA1C: No results for input(s): HGBA1C in the last 72 hours. CBG: No results for input(s): GLUCAP in the last 168 hours. Lipid Profile: No results for input(s): CHOL, HDL, LDLCALC, TRIG, CHOLHDL, LDLDIRECT in the last 72 hours. Thyroid Function  Tests: No results for input(s): TSH, T4TOTAL, FREET4, T3FREE, THYROIDAB in the last 72 hours. Anemia Panel: No results for input(s): VITAMINB12, FOLATE, FERRITIN, TIBC, IRON, RETICCTPCT in the last 72 hours. Urine analysis:    Component Value Date/Time   COLORURINE AMBER (A) 09/25/2017 0215   APPEARANCEUR HAZY (A) 09/25/2017 0215   LABSPEC 1.024 09/25/2017 0215   PHURINE 5.0 09/25/2017 0215   GLUCOSEU NEGATIVE 09/25/2017 0215   HGBUR NEGATIVE 09/25/2017 0215   BILIRUBINUR NEGATIVE 09/25/2017 0215   KETONESUR NEGATIVE 09/25/2017 0215   PROTEINUR NEGATIVE 09/25/2017 0215   NITRITE NEGATIVE 09/25/2017 0215   LEUKOCYTESUR MODERATE (A) 09/25/2017 0215   Sepsis Labs: Invalid input(s): PROCALCITONIN, LACTICIDVEN  Recent Results (from the past 240 hour(s))  Blood Culture (routine x 2)     Status: None (Preliminary result)   Collection Time: 01/27/18 12:47 PM  Result Value Ref Range Status   Specimen Description BLOOD RIGHT ANTECUBITAL  Final   Special Requests   Final    BOTTLES DRAWN AEROBIC AND ANAEROBIC Blood Culture results may not be optimal due to an inadequate volume of blood received in culture bottles   Culture   Final    NO GROWTH < 24 HOURS Performed at [facility] [address] [place], [place] [address]    Report Status PENDING  Incomplete  Blood Culture (routine x 2)     Status: None (Preliminary result)   Collection Time: 01/27/18  1:05 PM  Result Value Ref Range Status   Specimen Description BLOOD RIGHT HAND  Final   Special Requests   Final    BOTTLES DRAWN AEROBIC AND ANAEROBIC Blood Culture results may not be optimal due to an inadequate volume of blood received in culture bottles   Culture   Final    NO GROWTH < 24 HOURS Performed at [facility], [place] [address], [place], [place] [address]    Report Status PENDING  Incomplete      Radiology Studies: Dg Abd 1 View  Result Date: 01/27/2018 CLINICAL DATA:  Increased shortness of breath in the  past day, speaking in short phrases, history atrial fibrillation, remote history of lymphoma in remission EXAM: ABDOMEN - 1 VIEW COMPARISON:  None FINDINGS: Scattered air-filled loops of bowel in the mid abdomen and pelvis. No definite bowel wall thickening or bowel dilatation. Bones diffusely demineralized. Number of small calcifications are seen in the RIGHT abdomen extending over the iliac crest, uncertain if representing superimposed artifact or a very low-lying gallbladder containing calculi. Scattered atherosclerotic calcifications. IMPRESSION: Nonspecific bowel gas pattern. Cannot exclude cholelithiasis though this may represent superimposed artifacts; consider follow-up some RIGHT upper quadrant sonography. Electronically Signed   By: Lavonia Dana M.D.   On: 01/27/2018 17:12   Dg Chest Portable 1  View  Result Date: 01/27/2018 CLINICAL DATA:  Worsening shortness of breath. History of atrial fibrillation. EXAM: PORTABLE CHEST 1 VIEW COMPARISON:  PA and lateral chest 09/25/2017. FINDINGS: There is marked cardiomegaly. Distortion of the pulmonary architecture is seen and there is coarsening of the pulmonary interstitium most conspicuous in the left mid and lower lung zones. Airspace opacity is seen in the right upper lobe and there is coarsening of the interstitium in the left mid and lower lung zones. Small bilateral pleural effusions are seen. Aortic atherosclerosis is noted. No pneumothorax. 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 IMPRESSION: Right upper lobe airspace opacity worrisome for pneumonia. Interstitial opacities in the left mid and lower lung zones could also be due to pneumonia. Marked cardiomegaly without edema. Small bilateral pleural effusions. Atherosclerosis. Electronically Signed   By: Inge Rise M.D.   On: 01/27/2018 11:12     Scheduled Meds: . doxycycline  100 mg Oral Q12H  . guaiFENesin  1,200 mg Oral BID  . metoprolol succinate  50 mg Oral BID  . warfarin  2 mg  Oral ONCE-1800  . Warfarin - Pharmacist Dosing Inpatient   Does not apply q1800   Continuous Infusions: . cefTRIAXone (ROCEPHIN)  IV       Marzetta Board, MD, PhD [facility] Pager [phone]  If 7PM-7AM, please contact night-coverage [email or web address] Password [email or web address] 01/28/2018, 12:13 PM

## 2018-01-29 ENCOUNTER — Inpatient Hospital Stay (HOSPITAL_COMMUNITY): Payer: Medicare Other

## 2018-01-29 LAB — PROTIME-INR
INR: 2.79
Prothrombin Time: 29.2 seconds — ABNORMAL HIGH (ref 11.4–15.2)

## 2018-01-29 LAB — BASIC METABOLIC PANEL
Anion gap: 11 (ref 5–15)
BUN: 13 mg/dL (ref 6–20)
CHLORIDE: 94 mmol/L — AB (ref 101–111)
CO2: 31 mmol/L (ref 22–32)
CREATININE: 0.51 mg/dL (ref 0.44–1.00)
Calcium: 8.8 mg/dL — ABNORMAL LOW (ref 8.9–10.3)
Glucose, Bld: 106 mg/dL — ABNORMAL HIGH (ref 65–99)
POTASSIUM: 3.7 mmol/L (ref 3.5–5.1)
SODIUM: 136 mmol/L (ref 135–145)

## 2018-01-29 MED ORDER — IPRATROPIUM BROMIDE 0.02 % IN SOLN
0.5000 mg | Freq: Three times a day (TID) | RESPIRATORY_TRACT | Status: DC
  Administered 2018-01-30 – 2018-02-07 (×24): 0.5 mg via RESPIRATORY_TRACT
  Filled 2018-01-29 (×24): qty 2.5

## 2018-01-29 MED ORDER — LEVALBUTEROL HCL 0.63 MG/3ML IN NEBU
0.6300 mg | INHALATION_SOLUTION | Freq: Four times a day (QID) | RESPIRATORY_TRACT | Status: DC
  Administered 2018-01-29: 0.63 mg via RESPIRATORY_TRACT
  Filled 2018-01-29 (×2): qty 3

## 2018-01-29 MED ORDER — IPRATROPIUM BROMIDE 0.02 % IN SOLN
0.5000 mg | Freq: Four times a day (QID) | RESPIRATORY_TRACT | Status: DC
  Administered 2018-01-29: 0.5 mg via RESPIRATORY_TRACT
  Filled 2018-01-29 (×2): qty 2.5

## 2018-01-29 MED ORDER — LEVALBUTEROL HCL 0.63 MG/3ML IN NEBU
0.6300 mg | INHALATION_SOLUTION | Freq: Three times a day (TID) | RESPIRATORY_TRACT | Status: DC
  Administered 2018-01-30 – 2018-02-07 (×24): 0.63 mg via RESPIRATORY_TRACT
  Filled 2018-01-29 (×24): qty 3

## 2018-01-29 MED ORDER — FUROSEMIDE 10 MG/ML IJ SOLN
20.0000 mg | Freq: Once | INTRAMUSCULAR | Status: AC
  Administered 2018-01-29: 20 mg via INTRAVENOUS
  Filled 2018-01-29: qty 2

## 2018-01-29 MED ORDER — FUROSEMIDE 10 MG/ML IJ SOLN
40.0000 mg | Freq: Once | INTRAMUSCULAR | Status: AC
  Administered 2018-01-29: 40 mg via INTRAVENOUS
  Filled 2018-01-29: qty 4

## 2018-01-29 NOTE — Progress Notes (Signed)
ANTICOAGULATION CONSULT NOTE - Follow Up Consult  Pharmacy Consult for coumadin Indication: atrial fibrillation  Allergies  Allergen Reactions  . Azithromycin Other (See Comments)    unknown  . Ciprofloxacin     Felt like her head was on fire   Patient Measurements: Height: 5\' 6"  (167.6 cm) Weight: 128 lb (58.1 kg) IBW/kg (Calculated) : 59.3  Vital Signs: Temp: 98.8 F (37.1 C) (04/06 0444) Temp Source: Oral (04/06 0444) BP: 132/71 (04/06 0444) Pulse Rate: 93 (04/06 0444)  Labs: Recent Labs    01/27/18 1040 01/27/18 1250 01/28/18 0657 01/29/18 0158  HGB 12.9  --  11.6*  --   HCT 38.4  --  36.0  --   PLT 145*  --  111*  --   LABPROT  --  21.2* 22.6* 29.2*  INR  --  1.85 2.01 2.79  CREATININE 0.71  --  0.57  --    Estimated Creatinine Clearance: 42 mL/min (by C-G formula based on SCr of 0.57 mg/dL).  Medical History: Past Medical History:  Diagnosis Date  . A-fib ([place])   . Lymphoma ([place])    Remission for ~5 years now per patient (looks more like 11 years remission by [facility] onc notes)   Assessment: 89 YOF on coumadin PTA for afib, presented to the ED with SOB and started on rocephin and doxycycline. Pharmacy is following for warfarin dosing.   INR 2.7 today after receiving 2mg  x 2 doses. Concerned about rapid INR rise in response to slightly increased doses.  Will hold Coumadin tonight.  Home Regimen:  Warfarin 1.5mg  daily   Goal of Therapy:  INR 2-3 Monitor platelets by anticoagulation protocol: Yes   Plan:  - No Coumadin tonight  - Daily INR  [employer], Pharm.D., BCPS Clinical Pharmacist Pager: [phone] Clinical phone for 01/29/2018 from 8:30-4:00 is [phone]. After 4pm, please call [facility] [phone]) for assistance. 01/29/2018 11:33 AM

## 2018-01-29 NOTE — Evaluation (Signed)
Physical Therapy Evaluation Patient Details Name: Judith Hunter MRN: [id number] DOB: 1924-03-26 Today's Date: 01/29/2018   History of Present Illness  84 yo female with onset of sepssis PNA and acute hypoxia was admitted and noted quite weak.  Lives alone with some assistance, has PMHx: lymphoma, a-fib, atherosclerosis, cardiomegaly, chronic respiratory failure.  Clinical Impression  Pt is able to assist to chair with HHA as she needs to control use of LUE with residual pain from old humeral fracture.  Had comfortable positioning when her radiology tech arrived for imaging appt.  Reassisted to transfer with pivot, noted O2 sats were fluctuating from 95% at rest to 86% with effort and then back to 91%.  Pt is comfortable in bed and noted LUE on pillow to avoid pressure on the joints.  Follow acutely for recovery of as much independent gait and transfer skill as is possible and transition to SNF for care of her dependent mobility.    Follow Up Recommendations SNF    Equipment Recommendations  None recommended by PT    Recommendations for Other Services       Precautions / Restrictions Precautions Precautions: Fall Restrictions Weight Bearing Restrictions: No      Mobility  Bed Mobility Overal bed mobility: Needs Assistance Bed Mobility: Supine to Sit;Sit to Supine     Supine to sit: Mod assist(has old L humeral fracture with residual pain and edema) Sit to supine: Mod assist(pivoted with avoidance of WB on LUE)   General bed mobility comments: more dependent over old L humeral fracture with persistent pain  Transfers Overall transfer level: Needs assistance Equipment used: Rolling walker (2 wheeled);1 person hand held assist Transfers: Sit to/from [facility] Sit to Stand: Mod assist Stand pivot transfers: Mod assist       General transfer comment: one transfer with walker was not successful so used HHA, then pivoted to bed to return as radiology arrived to  get pt  Ambulation/Gait             General Gait Details: unable  Stairs            Wheelchair Mobility    Modified Rankin (Stroke Patients Only)       Balance Overall balance assessment: History of Falls;Needs assistance Sitting-balance support: Feet supported;Bilateral upper extremity supported Sitting balance-Leahy Scale: Poor     Standing balance support: Bilateral upper extremity supported;During functional activity Standing balance-Leahy Scale: Poor                               Pertinent Vitals/Pain Pain Assessment: Faces Faces Pain Scale: Hurts even more Pain Location: L humerus with WB Pain Intervention(s): Limited activity within patient's tolerance;Monitored during session;Repositioned    Home Living Family/patient expects to be discharged to:: Skilled nursing facility                 Additional Comments: home with minimal help and may be ready to transition to LTC    Prior Function Level of Independence: Needs assistance   Gait / Transfers Assistance Needed: has RW for home  ADL's / Homemaking Assistance Needed: has assistance from paid help for cooking and cleaning  Comments: used     Hand Dominance   Dominant Hand: Right    Extremity/Trunk Assessment   Upper Extremity Assessment Upper Extremity Assessment: Generalized weakness    Lower Extremity Assessment Lower Extremity Assessment: Generalized weakness    Cervical / Trunk Assessment Cervical /  Trunk Assessment: Kyphotic  Communication   Communication: HOH  Cognition Arousal/Alertness: Lethargic Behavior During Therapy: Flat affect Overall Cognitive Status: Within Functional Limits for tasks assessed                                 General Comments: pt is very lethargic but feeling poorly with O2 sats dropping during PT      General Comments General comments (skin integrity, edema, etc.): skin fragile and bruised, not able to tolerate  much activity     Exercises     Assessment/Plan    PT Assessment Patient needs continued PT services  PT Problem List Decreased strength;Decreased range of motion;Decreased activity tolerance;Decreased balance;Decreased mobility;Decreased coordination;Decreased knowledge of use of DME;Decreased safety awareness;Cardiopulmonary status limiting activity;Decreased knowledge of precautions;Decreased skin integrity;Pain       PT Treatment Interventions DME instruction;Gait training;Stair training;Functional mobility training;Therapeutic activities;Therapeutic exercise;Balance training;Neuromuscular re-education;Patient/family education    PT Goals (Current goals can be found in the Care Plan section)  Acute Rehab PT Goals Patient Stated Goal: to get stronger and hurt less PT Goal Formulation: With patient Time For Goal Achievement: 02/12/18 Potential to Achieve Goals: Good    Frequency Min 2X/week   Barriers to discharge Decreased caregiver support;Inaccessible home environment home with sporadic help    Co-evaluation               AM-PAC PT "6 Clicks" Daily Activity  Outcome Measure Difficulty turning over in bed (including adjusting bedclothes, sheets and blankets)?: Unable Difficulty moving from lying on back to sitting on the side of the bed? : Unable Difficulty sitting down on and standing up from a chair with arms (e.g., wheelchair, bedside commode, etc,.)?: Unable Help needed moving to and from a bed to chair (including a wheelchair)?: A Lot Help needed walking in hospital room?: A Lot Help needed climbing 3-5 steps with a railing? : Total 6 Click Score: 8    End of Session Equipment Utilized During Treatment: Gait belt;Oxygen Activity Tolerance: Patient limited by fatigue;Patient limited by pain Patient left: in bed;with call bell/phone within reach;with bed alarm set Nurse Communication: Mobility status PT Visit Diagnosis: Unsteadiness on feet (R26.81);Muscle  weakness (generalized) (M62.81);History of falling (Z91.81);Adult, failure to thrive (R62.7);Pain Pain - Right/Left: Left Pain - part of body: Arm    Time: 1209-1238 PT Time Calculation (min) (ACUTE ONLY): 29 min   Charges:   PT Evaluation $PT Eval Moderate Complexity: 1 Mod PT Treatments $Therapeutic Activity: 8-22 mins   PT G Codes:   PT G-Codes **NOT FOR INPATIENT CLASS** Functional Assessment Tool Used: AM-PAC 6 Clicks Basic Mobility    Ramond Dial 01/29/2018, 4:06 PM   Mee Hives, PT MS Acute Rehab Dept. Number: [place] [facility] [place]

## 2018-01-29 NOTE — Progress Notes (Signed)
PROGRESS NOTE  Rockell Faulks ZOX:[id number] DOB: 12-26-1923 DOA: 01/27/2018 PCP: Patient, No Pcp Per   LOS: 2 days   Brief Narrative / Interim history: 84 year old female with history of A. fib, lymphoma, presents to the emergency room with shortness of breath.  She was seen yesterday in her orthopedist office for back pain, she sounded congested and short of breath so she was sent to the ED.  Chest x-ray in the ED showed multilobar pneumonia, she was started on ceftriaxone and doxycycline and was admitted to the hospital  Assessment & Plan: Active Problems:   Sepsis due to pneumonia ([place])  Sepsis due to pneumonia -Started on ceftriaxone and doxycycline, continue -Cultures are pending -Scheduled DuoNeb's today  Acute hypoxic respiratory failure -In the setting of multifocal pneumonia, continue antibiotics as above, currently on 3 L nasal cannula, wean off oxygen as tolerated -Attempted to wean off to room air this morning however desatted into the low 80s at rest, put back on 3 L, has received Lasix yesterday and will repeat dose today -Repeat chest x-ray today  Hypokalemia -improved after repletion, continue to monitor   Atrial fibrillation - patient's CHA2DS2-VASc Score for Stroke Risk is > 2, continue rate control with metoprolol as well as anticoagulation with Coumadin   DVT prophylaxis: On Coumadin Code Status: Full code, confirmed with patient Family Communication: No family at bedside Disposition Plan: Home versus SNF when ready, PT consulted  Consultants:   None   Procedures:   None   Antimicrobials:  Ceftriaxone 4/4 >>  Doxycycline 4/4 >>    Subjective: -Feeling slightly short of breath, feels improved overall, no chest pain, no palpitations, no abdominal pain, nausea or vomiting  Objective: Vitals:   01/28/18 1317 01/28/18 2221 01/29/18 0444 01/29/18 0451  BP: 121/84 (!) 143/87 132/71   Pulse: 78 (!) 102 93   Resp: 20 20 20    Temp: 99.3 F (37.4 C)  97.9 F (36.6 C) 98.8 F (37.1 C)   TempSrc: Oral Oral Oral   SpO2: 99% 96% 93%   Weight:    58.1 kg (128 lb)  Height:        Intake/Output Summary (Last 24 hours) at 01/29/2018 1149 Last data filed at 01/28/2018 2000 Gross per 24 hour  Intake 100 ml  Output 800 ml  Net -700 ml   Filed Weights   01/27/18 1735 01/28/18 0454 01/29/18 0451  Weight: 54 kg (119 lb) 53.1 kg (117 lb) 58.1 kg (128 lb)    Examination:  Constitutional: No distress, eating breakfast Eyes: No scleral icterus ENMT: Moist mucous membranes Respiratory: Coarse breath sounds bilaterally without wheezing.  Normal respiratory effort Cardiovascular: Regular rate and rhythm without murmurs.  No edema Abdomen: Soft, nontender, nondistended Neurologic: Nonfocal, equal strength   Data Reviewed: I have independently reviewed following labs and imaging studies   CBC: Recent Labs  Lab 01/27/18 1040 01/28/18 0657  WBC 5.9 4.3  NEUTROABS  --  3.3  HGB 12.9 11.6*  HCT 38.4 36.0  MCV 93.4 96.5  PLT 145* 811*   Basic Metabolic Panel: Recent Labs  Lab 01/27/18 1040 01/27/18 1728 01/28/18 0657  NA 134*  --  134*  K 3.1*  --  3.7  CL 91*  --  97*  CO2 28  --  30  GLUCOSE 118*  --  93  BUN 11  --  12  CREATININE 0.71  --  0.57  CALCIUM 8.7*  --  8.6*  MG  --  1.6*  --  PHOS  --  3.1  --    GFR: Estimated Creatinine Clearance: 42 mL/min (by C-G formula based on SCr of 0.57 mg/dL). Liver Function Tests: Recent Labs  Lab 01/27/18 1728  AST 38  ALT 17  ALKPHOS 245*  BILITOT 1.1  PROT 5.2*  ALBUMIN 2.8*   No results for input(s): LIPASE, AMYLASE in the last 168 hours. No results for input(s): AMMONIA in the last 168 hours. Coagulation Profile: Recent Labs  Lab 01/27/18 1250 01/28/18 0657 01/29/18 0158  INR 1.85 2.01 2.79   Cardiac Enzymes: No results for input(s): CKTOTAL, CKMB, CKMBINDEX, TROPONINI in the last 168 hours. BNP (last 3 results) No results for input(s): PROBNP in the last  8760 hours. HbA1C: No results for input(s): HGBA1C in the last 72 hours. CBG: No results for input(s): GLUCAP in the last 168 hours. Lipid Profile: No results for input(s): CHOL, HDL, LDLCALC, TRIG, CHOLHDL, LDLDIRECT in the last 72 hours. Thyroid Function Tests: No results for input(s): TSH, T4TOTAL, FREET4, T3FREE, THYROIDAB in the last 72 hours. Anemia Panel: No results for input(s): VITAMINB12, FOLATE, FERRITIN, TIBC, IRON, RETICCTPCT in the last 72 hours. Urine analysis:    Component Value Date/Time   COLORURINE AMBER (A) 09/25/2017 0215   APPEARANCEUR HAZY (A) 09/25/2017 0215   LABSPEC 1.024 09/25/2017 0215   PHURINE 5.0 09/25/2017 0215   GLUCOSEU NEGATIVE 09/25/2017 0215   HGBUR NEGATIVE 09/25/2017 0215   BILIRUBINUR NEGATIVE 09/25/2017 0215   KETONESUR NEGATIVE 09/25/2017 0215   PROTEINUR NEGATIVE 09/25/2017 0215   NITRITE NEGATIVE 09/25/2017 0215   LEUKOCYTESUR MODERATE (A) 09/25/2017 0215   Sepsis Labs: Invalid input(s): PROCALCITONIN, LACTICIDVEN  Recent Results (from the past 240 hour(s))  Blood Culture (routine x 2)     Status: None (Preliminary result)   Collection Time: 01/27/18 12:47 PM  Result Value Ref Range Status   Specimen Description BLOOD RIGHT ANTECUBITAL  Final   Special Requests   Final    BOTTLES DRAWN AEROBIC AND ANAEROBIC Blood Culture results may not be optimal due to an inadequate volume of blood received in culture bottles   Culture   Final    NO GROWTH 2 DAYS Performed at [facility] [address] [place], [place] [address]    Report Status PENDING  Incomplete  Blood Culture (routine x 2)     Status: None (Preliminary result)   Collection Time: 01/27/18  1:05 PM  Result Value Ref Range Status   Specimen Description BLOOD RIGHT HAND  Final   Special Requests   Final    BOTTLES DRAWN AEROBIC AND ANAEROBIC Blood Culture results may not be optimal due to an inadequate volume of blood received in culture bottles   Culture   Final     NO GROWTH 2 DAYS Performed at [facility], [place] [address], [place], [place] [address]    Report Status PENDING  Incomplete      Radiology Studies: Dg Abd 1 View  Result Date: 01/27/2018 CLINICAL DATA:  Increased shortness of breath in the past day, speaking in short phrases, history atrial fibrillation, remote history of lymphoma in remission EXAM: ABDOMEN - 1 VIEW COMPARISON:  None FINDINGS: Scattered air-filled loops of bowel in the mid abdomen and pelvis. No definite bowel wall thickening or bowel dilatation. Bones diffusely demineralized. Number of small calcifications are seen in the RIGHT abdomen extending over the iliac crest, uncertain if representing superimposed artifact or a very low-lying gallbladder containing calculi. Scattered atherosclerotic calcifications. IMPRESSION: Nonspecific  bowel gas pattern. Cannot exclude cholelithiasis though this may represent superimposed artifacts; consider follow-up some RIGHT upper quadrant sonography. Electronically Signed   By: Lavonia Dana M.D.   On: 01/27/2018 17:12     Scheduled Meds: . doxycycline  100 mg Oral Q12H  . guaiFENesin  1,200 mg Oral BID  . ipratropium  0.5 mg Nebulization Q6H  . levalbuterol  0.63 mg Nebulization Q6H  . LORazepam  0.5 mg Oral Once  . metoprolol succinate  50 mg Oral BID  . Warfarin - Pharmacist Dosing Inpatient   Does not apply q1800   Continuous Infusions: . cefTRIAXone (ROCEPHIN)  IV Stopped (01/28/18 1529)     Marzetta Board, MD, PhD [facility] Pager [phone]  If 7PM-7AM, please contact night-coverage [email or web address] Password [email or web address] 01/29/2018, 11:49 AM

## 2018-01-30 ENCOUNTER — Other Ambulatory Visit: Payer: Self-pay

## 2018-01-30 DIAGNOSIS — R0902 Hypoxemia: Secondary | ICD-10-CM

## 2018-01-30 DIAGNOSIS — I5033 Acute on chronic diastolic (congestive) heart failure: Secondary | ICD-10-CM

## 2018-01-30 DIAGNOSIS — J810 Acute pulmonary edema: Secondary | ICD-10-CM

## 2018-01-30 LAB — CBC
HEMATOCRIT: 38.3 % (ref 36.0–46.0)
Hemoglobin: 11.9 g/dL — ABNORMAL LOW (ref 12.0–15.0)
MCH: 30.7 pg (ref 26.0–34.0)
MCHC: 31.1 g/dL (ref 30.0–36.0)
MCV: 99 fL (ref 78.0–100.0)
Platelets: 120 10*3/uL — ABNORMAL LOW (ref 150–400)
RBC: 3.87 MIL/uL (ref 3.87–5.11)
RDW: 16.1 % — AB (ref 11.5–15.5)
WBC: 3.9 10*3/uL — AB (ref 4.0–10.5)

## 2018-01-30 LAB — BASIC METABOLIC PANEL
ANION GAP: 10 (ref 5–15)
BUN: 15 mg/dL (ref 6–20)
CO2: 32 mmol/L (ref 22–32)
Calcium: 8.7 mg/dL — ABNORMAL LOW (ref 8.9–10.3)
Chloride: 96 mmol/L — ABNORMAL LOW (ref 101–111)
Creatinine, Ser: 0.48 mg/dL (ref 0.44–1.00)
GFR calc Af Amer: >60 mL/min (ref >60)
GFR calc non Af Amer: >60 mL/min (ref >60)
Glucose, Bld: 105 mg/dL — ABNORMAL HIGH (ref 65–99)
POTASSIUM: 3.5 mmol/L (ref 3.5–5.1)
SODIUM: 138 mmol/L (ref 135–145)

## 2018-01-30 LAB — PROTIME-INR
INR: 3.5
Prothrombin Time: 34.9 seconds — ABNORMAL HIGH (ref 11.4–15.2)

## 2018-01-30 MED ORDER — FUROSEMIDE 10 MG/ML IJ SOLN
40.0000 mg | Freq: Two times a day (BID) | INTRAMUSCULAR | Status: DC
  Administered 2018-01-30 – 2018-02-01 (×5): 40 mg via INTRAVENOUS
  Filled 2018-01-30 (×5): qty 4

## 2018-01-30 MED ORDER — LORAZEPAM 0.5 MG PO TABS
0.5000 mg | ORAL_TABLET | Freq: Once | ORAL | Status: AC
  Administered 2018-01-30: 0.5 mg via ORAL

## 2018-01-30 MED ORDER — METOPROLOL TARTRATE 5 MG/5ML IV SOLN
5.0000 mg | Freq: Once | INTRAVENOUS | Status: AC
  Administered 2018-01-31: 5 mg via INTRAVENOUS
  Filled 2018-01-30: qty 5

## 2018-01-30 MED ORDER — POTASSIUM CHLORIDE CRYS ER 20 MEQ PO TBCR
40.0000 meq | EXTENDED_RELEASE_TABLET | Freq: Once | ORAL | Status: AC
  Administered 2018-01-30: 40 meq via ORAL
  Filled 2018-01-30: qty 2

## 2018-01-30 NOTE — NC FL2 (Signed)
[place] LEVEL OF CARE SCREENING TOOL     IDENTIFICATION  Patient Name: Judith Hunter Birthdate: 1924-04-19 Sex: female Admission Date (Current Location): 01/27/2018  [facility] and [place] Number:  Herbalist and Address:  [facility], [place] [address], [place], [place] [address]      Provider Number: [id number]  Attending Physician Name and Address:  Caren Griffins, MD  Relative Name and Phone Number:       Current Level of Care: Hospital Recommended Level of Care: [place] Prior Approval Number:    Date Approved/Denied:   PASRR Number:    Discharge Plan: SNF    Current Diagnoses: Patient Active Problem List   Diagnosis Date Noted  . Sepsis due to pneumonia ([place]) 01/27/2018  . Pain   . Shoulder fracture, left 09/28/2017  . Closed fracture of head of left humerus 09/25/2017  . A-fib ([place]) 09/25/2017    Orientation RESPIRATION BLADDER Height & Weight     Self, Time, Situation, Place  O2(4L [place]) Incontinent, External catheter Weight: 128 lb (58.1 kg) Height:  5\' 6"  (167.6 cm)  BEHAVIORAL SYMPTOMS/MOOD NEUROLOGICAL BOWEL NUTRITION STATUS      Continent Diet(see DC summary)  AMBULATORY STATUS COMMUNICATION OF NEEDS Skin   Extensive Assist Verbally Normal                       Personal Care Assistance Level of Assistance  Bathing, Dressing Bathing Assistance: Maximum assistance   Dressing Assistance: Maximum assistance     Functional Limitations Info             SPECIAL CARE FACTORS FREQUENCY  PT (By licensed PT), OT (By licensed OT)     PT Frequency: 5/wk OT Frequency: 5/wk            Contractures      Additional Factors Info  Code Status, Allergies, Isolation Precautions Code Status Info: FULL Allergies Info: Azithromycin, Ciprofloxacin     Isolation Precautions Info: MRSA     Current Medications (01/30/2018):  This is the current hospital active medication list Current  Facility-Administered Medications  Medication Dose Route Frequency Provider Last Rate Last Dose  . acetaminophen (TYLENOL) tablet 650 mg  650 mg Oral Q6H PRN Fuller Plan A, MD   650 mg at 01/30/18 0148  . cefTRIAXone (ROCEPHIN) 1 g in sodium chloride 0.9 % 100 mL IVPB  1 g Intravenous Q24H Elwin Mocha, MD   Stopped at 01/29/18 1400  . doxycycline (VIBRA-TABS) tablet 100 mg  100 mg Oral Q12H Elwin Mocha, MD   100 mg at 01/30/18 9371  . furosemide (LASIX) injection 40 mg  40 mg Intravenous Q12H Caren Griffins, MD   40 mg at 01/30/18 1000  . guaiFENesin (MUCINEX) 12 hr tablet 1,200 mg  1,200 mg Oral BID Elwin Mocha, MD   1,200 mg at 01/30/18 0957  . ipratropium (ATROVENT) nebulizer solution 0.5 mg  0.5 mg Nebulization TID Caren Griffins, MD   0.5 mg at 01/30/18 1512  . levalbuterol (XOPENEX) nebulizer solution 0.63 mg  0.63 mg Nebulization TID Caren Griffins, MD   0.63 mg at 01/30/18 1512  . metoprolol succinate (TOPROL-XL) 24 hr tablet 50 mg  50 mg Oral BID Elwin Mocha, MD   50 mg at 01/30/18 6967  . Warfarin - Pharmacist Dosing Inpatient   Does not apply q1800 Leodis Sias, [employer]   Stopped at 01/29/18  1800     Discharge Medications: Please see discharge summary for a list of discharge medications.  Relevant Imaging Results:  Relevant Lab Results:   Additional Information SS#: [id number]  Jorge Ny, LCSW

## 2018-01-30 NOTE — Progress Notes (Signed)
PROGRESS NOTE  Judith Hunter ZOX:[id number] DOB: July 29, 1924 DOA: 01/27/2018 PCP: Patient, No Pcp Per   LOS: 3 days   Brief Narrative / Interim history: 84 year old female with history of A. fib, lymphoma, presents to the emergency room with shortness of breath.  She was seen yesterday in her orthopedist office for back pain, she sounded congested and short of breath so she was sent to the ED.  Chest x-ray in the ED showed multilobar pneumonia, she was started on ceftriaxone and doxycycline and was admitted to the hospital  Assessment & Plan: Active Problems:   Sepsis due to pneumonia ([place])  Sepsis due to pneumonia -Started on ceftriaxone and doxycycline, continue -Cultures are pending -Scheduled DuoNeb's today  Acute hypoxic respiratory failure  -multifactorial due to PNA and pulmonary edema  -CXR 4/6 personally reviewed, worsening CHF pattern -worsening oxygen requirements today, however she is a mouth breather so may not be entirely accurate   Acute on chronic CHF, unknown type -no prior 2D echoes in the system, obtain a 2D echo -place on IV Lasix, closely monitor renal function, K levels   Hypokalemia -replete today, BMP in am   Atrial fibrillation - patient's CHA2DS2-VASc Score for Stroke Risk is > 2, continue rate control with metoprolol as well as anticoagulation with Coumadin   DVT prophylaxis: On Coumadin Code Status: Full code, confirmed with patient Family Communication: No family at bedside Disposition Plan: SNF when breathing improves   Consultants:   None   Procedures:   None   Antimicrobials:  Ceftriaxone 4/4 >>  Doxycycline 4/4 >>    Subjective: -continues to feel short of breath, no chest pain, no abdominal pain, nausea/vomiting   Objective: Vitals:   01/29/18 1829 01/29/18 2100 01/29/18 2137 01/30/18 0645  BP: 130/75 (!) 134/91  127/62  Pulse: (!) 101 (!) 112  97  Resp: 17 17  17   Temp: 98.4 F (36.9 C) 98 F (36.7 C)  98.5 F (36.9 C)   TempSrc: Oral Oral  Axillary  SpO2: 96% 94% 91% 94%  Weight:      Height:       No intake or output data in the 24 hours ending 01/30/18 1103 Filed Weights   01/27/18 1735 01/28/18 0454 01/29/18 0451  Weight: 54 kg (119 lb) 53.1 kg (117 lb) 58.1 kg (128 lb)    Examination:  Constitutional: NAD but appears tachypneic  Eyes: no scleral icterus, lids and conjunctivae pale appearing  ENMT: MMM Respiratory: coarse breath sounds bilaterally, decreased at the bases, crackles present, no wheezing  Cardiovascular: RRR without murmurs Abdomen: soft, NT, ND, BS +  Neurologic: Nonfocal, equal strength   Data Reviewed: I have independently reviewed following labs and imaging studies   CXR: increased CHF patters   CBC: Recent Labs  Lab 01/27/18 1040 01/28/18 0657 01/30/18 0419  WBC 5.9 4.3 3.9*  NEUTROABS  --  3.3  --   HGB 12.9 11.6* 11.9*  HCT 38.4 36.0 38.3  MCV 93.4 96.5 99.0  PLT 145* 111* 811*   Basic Metabolic Panel: Recent Labs  Lab 01/27/18 1040 01/27/18 1728 01/28/18 0657 01/29/18 1721 01/30/18 0419  NA 134*  --  134* 136 138  K 3.1*  --  3.7 3.7 3.5  CL 91*  --  97* 94* 96*  CO2 28  --  30 31 32  GLUCOSE 118*  --  93 106* 105*  BUN 11  --  12 13 15   CREATININE 0.71  --  0.57 0.51 0.48  CALCIUM 8.7*  --  8.6* 8.8* 8.7*  MG  --  1.6*  --   --   --   PHOS  --  3.1  --   --   --    GFR: Estimated Creatinine Clearance: 42 mL/min (by C-G formula based on SCr of 0.48 mg/dL). Liver Function Tests: Recent Labs  Lab 01/27/18 1728  AST 38  ALT 17  ALKPHOS 245*  BILITOT 1.1  PROT 5.2*  ALBUMIN 2.8*   No results for input(s): LIPASE, AMYLASE in the last 168 hours. No results for input(s): AMMONIA in the last 168 hours. Coagulation Profile: Recent Labs  Lab 01/27/18 1250 01/28/18 0657 01/29/18 0158 01/30/18 0419  INR 1.85 2.01 2.79 3.50   Cardiac Enzymes: No results for input(s): CKTOTAL, CKMB, CKMBINDEX, TROPONINI in the last 168 hours. BNP (last  3 results) No results for input(s): PROBNP in the last 8760 hours. HbA1C: No results for input(s): HGBA1C in the last 72 hours. CBG: No results for input(s): GLUCAP in the last 168 hours. Lipid Profile: No results for input(s): CHOL, HDL, LDLCALC, TRIG, CHOLHDL, LDLDIRECT in the last 72 hours. Thyroid Function Tests: No results for input(s): TSH, T4TOTAL, FREET4, T3FREE, THYROIDAB in the last 72 hours. Anemia Panel: No results for input(s): VITAMINB12, FOLATE, FERRITIN, TIBC, IRON, RETICCTPCT in the last 72 hours. Urine analysis:    Component Value Date/Time   COLORURINE AMBER (A) 09/25/2017 0215   APPEARANCEUR HAZY (A) 09/25/2017 0215   LABSPEC 1.024 09/25/2017 0215   PHURINE 5.0 09/25/2017 0215   GLUCOSEU NEGATIVE 09/25/2017 0215   HGBUR NEGATIVE 09/25/2017 0215   BILIRUBINUR NEGATIVE 09/25/2017 0215   KETONESUR NEGATIVE 09/25/2017 0215   PROTEINUR NEGATIVE 09/25/2017 0215   NITRITE NEGATIVE 09/25/2017 0215   LEUKOCYTESUR MODERATE (A) 09/25/2017 0215   Sepsis Labs: Invalid input(s): PROCALCITONIN, LACTICIDVEN  Recent Results (from the past 240 hour(s))  Blood Culture (routine x 2)     Status: None (Preliminary result)   Collection Time: 01/27/18 12:47 PM  Result Value Ref Range Status   Specimen Description BLOOD RIGHT ANTECUBITAL  Final   Special Requests   Final    BOTTLES DRAWN AEROBIC AND ANAEROBIC Blood Culture results may not be optimal due to an inadequate volume of blood received in culture bottles   Culture   Final    NO GROWTH 2 DAYS Performed at [facility] [address], [place], [place] [address]    Report Status PENDING  Incomplete  Blood Culture (routine x 2)     Status: None (Preliminary result)   Collection Time: 01/27/18  1:05 PM  Result Value Ref Range Status   Specimen Description BLOOD RIGHT HAND  Final   Special Requests   Final    BOTTLES DRAWN AEROBIC AND ANAEROBIC Blood Culture results may not be optimal due to an inadequate volume of  blood received in culture bottles   Culture   Final    NO GROWTH 2 DAYS Performed at [facility], [place] [address], [place], [place] [address]    Report Status PENDING  Incomplete      Radiology Studies: Dg Chest 2 View  Result Date: 01/29/2018 CLINICAL DATA:  Hypoxia.  Atrial fibrillation. EXAM: CHEST - 2 VIEW COMPARISON:  01/27/2018 FINDINGS: Radiographic worsening with enlarging effusions an worsened lower lobe atelectasis and or pneumonia. Diffuse edema pattern persists and is worsened. IMPRESSION: Worsening of congestive heart failure pattern with enlarging effusions and worsened lower lobe volume loss and/or infiltrate.  Electronically Signed   By: Nelson Chimes M.D.   On: 01/29/2018 15:58     Scheduled Meds: . doxycycline  100 mg Oral Q12H  . furosemide  40 mg Intravenous Q12H  . guaiFENesin  1,200 mg Oral BID  . ipratropium  0.5 mg Nebulization TID  . levalbuterol  0.63 mg Nebulization TID  . metoprolol succinate  50 mg Oral BID  . Warfarin - Pharmacist Dosing Inpatient   Does not apply q1800   Continuous Infusions: . cefTRIAXone (ROCEPHIN)  IV Stopped (01/29/18 1400)     Marzetta Board, MD, PhD [facility] Pager [phone]  If 7PM-7AM, please contact night-coverage [email or web address] Password [email or web address] 01/30/2018, 11:03 AM

## 2018-01-30 NOTE — Progress Notes (Signed)
ANTICOAGULATION CONSULT NOTE - Follow Up Consult  Pharmacy Consult for coumadin Indication: atrial fibrillation  Allergies  Allergen Reactions  . Azithromycin Other (See Comments)    unknown  . Ciprofloxacin     Felt like her head was on fire   Patient Measurements: Height: 5\' 6"  (167.6 cm) Weight: 128 lb (58.1 kg) IBW/kg (Calculated) : 59.3  Vital Signs: Temp: 98.5 F (36.9 C) (04/07 0645) Temp Source: Axillary (04/07 0645) BP: 127/62 (04/07 0645) Pulse Rate: 97 (04/07 0645)  Labs: Recent Labs    01/28/18 0657 01/29/18 0158 01/29/18 1721 01/30/18 0419  HGB 11.6*  --   --  11.9*  HCT 36.0  --   --  38.3  PLT 111*  --   --  120*  LABPROT 22.6* 29.2*  --  34.9*  INR 2.01 2.79  --  3.50  CREATININE 0.57  --  0.51 0.48   Estimated Creatinine Clearance: 42 mL/min (by C-G formula based on SCr of 0.48 mg/dL).  Medical History: Past Medical History:  Diagnosis Date  . A-fib ([place])   . Lymphoma ([place])    Remission for ~5 years now per patient (looks more like 11 years remission by [facility] onc notes)   Assessment: 89 YOF on coumadin PTA for afib, presented to the ED with SOB and started on rocephin and doxycycline. Pharmacy is following for warfarin dosing.   INR up to 3.5 today after receiving 2mg  x 2 doses. Concerned about rapid INR rise in response to slightly increased doses.  Will hold Coumadin tonight.  Home Regimen:  Warfarin 1.5mg  daily   Goal of Therapy:  INR 2-3 Monitor platelets by anticoagulation protocol: Yes   Plan:  - No Coumadin tonight  - Daily INR  [employer], Pharm.D., BCPS Clinical Pharmacist Pager: [phone] Clinical phone for 01/30/2018 from 8:30-4:00 is [phone]. After 4pm, please call Main Rx [phone]) for assistance. 01/30/2018 11:48 AM

## 2018-01-31 ENCOUNTER — Encounter (INDEPENDENT_AMBULATORY_CARE_PROVIDER_SITE_OTHER): Payer: Self-pay | Admitting: Orthopedic Surgery

## 2018-01-31 ENCOUNTER — Inpatient Hospital Stay (HOSPITAL_COMMUNITY): Payer: Medicare Other

## 2018-01-31 DIAGNOSIS — I361 Nonrheumatic tricuspid (valve) insufficiency: Secondary | ICD-10-CM

## 2018-01-31 DIAGNOSIS — I351 Nonrheumatic aortic (valve) insufficiency: Secondary | ICD-10-CM

## 2018-01-31 LAB — CBC
HCT: 40.7 % (ref 36.0–46.0)
Hemoglobin: 12.5 g/dL (ref 12.0–15.0)
MCH: 30 pg (ref 26.0–34.0)
MCHC: 30.7 g/dL (ref 30.0–36.0)
MCV: 97.6 fL (ref 78.0–100.0)
PLATELETS: 100 10*3/uL — AB (ref 150–400)
RBC: 4.17 MIL/uL (ref 3.87–5.11)
RDW: 15.7 % — ABNORMAL HIGH (ref 11.5–15.5)
WBC: 6 10*3/uL (ref 4.0–10.5)

## 2018-01-31 LAB — ECHOCARDIOGRAM COMPLETE
Height: 66 in
WEIGHTICAEL: 2048 [oz_av]

## 2018-01-31 LAB — BASIC METABOLIC PANEL
ANION GAP: 12 (ref 5–15)
BUN: 15 mg/dL (ref 6–20)
CO2: 30 mmol/L (ref 22–32)
Calcium: 8.5 mg/dL — ABNORMAL LOW (ref 8.9–10.3)
Chloride: 96 mmol/L — ABNORMAL LOW (ref 101–111)
Creatinine, Ser: 0.43 mg/dL — ABNORMAL LOW (ref 0.44–1.00)
GFR calc Af Amer: >60 mL/min (ref >60)
GFR calc non Af Amer: >60 mL/min (ref >60)
GLUCOSE: 115 mg/dL — AB (ref 65–99)
POTASSIUM: 3.6 mmol/L (ref 3.5–5.1)
Sodium: 138 mmol/L (ref 135–145)

## 2018-01-31 LAB — PROTIME-INR
INR: 3.44
Prothrombin Time: 34.4 seconds — ABNORMAL HIGH (ref 11.4–15.2)

## 2018-01-31 LAB — MRSA PCR SCREENING: MRSA by PCR: NEGATIVE

## 2018-01-31 MED ORDER — POTASSIUM CHLORIDE CRYS ER 20 MEQ PO TBCR
40.0000 meq | EXTENDED_RELEASE_TABLET | Freq: Once | ORAL | Status: AC
  Administered 2018-01-31: 40 meq via ORAL
  Filled 2018-01-31: qty 2

## 2018-01-31 MED ORDER — METOPROLOL SUCCINATE ER 100 MG PO TB24
100.0000 mg | ORAL_TABLET | Freq: Two times a day (BID) | ORAL | Status: DC
  Administered 2018-01-31 – 2018-02-08 (×17): 100 mg via ORAL
  Filled 2018-01-31 (×17): qty 1

## 2018-01-31 NOTE — Progress Notes (Signed)
Office Visit Note   Patient: Judith Hunter           Date of Birth: Feb 10, 1924           MRN: [id number] Visit Date: 01/27/2018 Requested by: No referring provider defined for this encounter. PCP: Patient, No Pcp Per  Subjective: Chief Complaint  Patient presents with  . Left Shoulder - Follow-up, Fracture    HPI: Judith Hunter is a patient with left proximal humerus fracture.  She underwent closed reduction.  Date of injury 09/24/2018.  I recheck today was to decide whether or not closed reduction was indicated.  Her son is present with her today.  He indicates a generally declining course over the past several months.  She also reports some low back pain.  She twisted in her bed but did not have a discrete injury to the low back.  She states that her left arm is actually okay.  She did rehabilitation for the left arm and currently it is functional.  She has some type of fluid removed from her lungs.  She is having difficulty with breathing today.              ROS: All systems reviewed are negative as they relate to the chief complaint within the history of present illness.  Patient denies  fevers or chills.   Assessment & Plan: Visit Diagnoses:  1. Low back pain, unspecified back pain laterality, unspecified chronicity, with sciatica presence unspecified     Plan: Impression is low back pain with definite possibility of acute or subacute compression fractures.  More problematic for Judith Hunter today though is her respiratory status.  She is having some issues with oxygen exchange.  I think she needs evaluation for admission into the hospital.  She may have pneumonia.  Could consider working up the lumbar spine at that time but her more pressing concern is her pulmonary status.  I will see her back as needed.  We did have her transported to the emergency room today for further evaluation and I did call the emergency room physician to inform them that she was coming.  Follow-Up Instructions: No  follow-ups on file.   Orders:  Orders Placed This Encounter  Procedures  . XR Lumbar Spine 2-3 Views   No orders of the defined types were placed in this encounter.     Procedures: No procedures performed   Clinical Data: No additional findings.  Objective: Vital Signs: There were no vitals taken for this visit.  Physical Exam:   Constitutional: Patient appears well-developed HEENT:  Head: Normocephalic Eyes:EOM are normal Neck: Normal range of motion Cardiovascular: Normal rate Pulmonary/chest: Effort normal but labored at times Neurologic: Patient is alert Skin: Skin is warm Psychiatric: Patient has normal mood and affect    Ortho Exam: Orthopedic exam demonstrates not much in the way of pain with passive or active range of motion of that left shoulder.  Motor sensory function to the hand is intact.  She does have some tenderness to palpation along the thoracolumbar spine region.  She is in a wheelchair.  She has pretty reasonable ankle dorsiflexion plantarflexion strength.  Not much in the way of great hip flexion strength but it is present.  No groin pain with internal/external rotation of either leg.  No definite paresthesias L1-S1 bilaterally. Specialty Comments:  No specialty comments available.  Imaging: No results found.   PMFS History: Patient Active Problem List   Diagnosis Date Noted  . Sepsis due to pneumonia (  [place]) 01/27/2018  . Pain   . Shoulder fracture, left 09/28/2017  . Closed fracture of head of left humerus 09/25/2017  . A-fib ([place]) 09/25/2017   Past Medical History:  Diagnosis Date  . A-fib ([place])   . Lymphoma ([place])    Remission for ~5 years now per patient (looks more like 11 years remission by [facility] onc notes)    History reviewed. No pertinent family history.  Past Surgical History:  Procedure Laterality Date  . ORIF HUMERUS FRACTURE Left 09/28/2017   Procedure: CLOSED REDUCTION LEFT PROXIMAL HUMERUS FRACTURE;  Surgeon: Meredith Pel, MD;  Location: [facility];  Service: Orthopedics;  Laterality: Left;   Social History   Occupational History  . Not on file  Tobacco Use  . Smoking status: Never Smoker  . Smokeless tobacco: Never Used  Substance and Sexual Activity  . Alcohol use: No    Frequency: Never  . Drug use: No  . Sexual activity: Never

## 2018-01-31 NOTE — Progress Notes (Signed)
  Echocardiogram 2D Echocardiogram has been performed.  Jannett Celestine 01/31/2018, 2:51 PM

## 2018-01-31 NOTE — Progress Notes (Signed)
Patient in afib, running 115 - 125 sustained. Pt asymptomatic and resting. Notified Bodenheimer, NP and received order for metoprolol IV. Will continue to monitor.

## 2018-01-31 NOTE — Progress Notes (Signed)
Initial Nutrition Assessment  DOCUMENTATION CODES:   Severe malnutrition in context of chronic illness  INTERVENTION:   - Magic cup TID with meals, each supplement provides 290 kcal and 9 grams of protein  NUTRITION DIAGNOSIS:   Severe Malnutrition related to poor appetite, chronic illness(CHF) as evidenced by moderate fat depletion, severe fat depletion, moderate muscle depletion, severe muscle depletion.  GOAL:   Patient will meet greater than or equal to 90% of their needs  MONITOR:    PO intake, Supplement acceptance  REASON FOR ASSESSMENT:   Malnutrition Screening Tool    ASSESSMENT:   84 year old female who presented to the ED with SOB. PMH significant for atrial fibrillation and lymphoma. Pt admitted for evaluation and treatment of sepsis secondary to pneumonia.  Spoke with pt who states she has not been hungry and "only eats a few bites" at meals. Pt unable to elaborate and is poor historian regarding diet and weight history. No family present at time of visit. RD noted lunch tray in room at time of visit with approximately 10% completion. Pt mainly ate pudding with bites of other items on tray. RD to order Magic Cup with meal trays.  Pt "suspects" she has lost weight recently. Unsure how much.  Pt endorses difficulty swallowing. Pt states that she feels "a lump in my throat."  Meal Completion: 10%  Medications reviewed and include: 40 mg Lasix BID, warfarin, IV antibiotics  Labs reviewed: creatinine 0.43, magnesium 1.6 on 01/27/18  NUTRITION - FOCUSED PHYSICAL EXAM:    Most Recent Value  Orbital Region  Moderate depletion  Upper Arm Region  Severe depletion  Thoracic and Lumbar Region  Severe depletion  Buccal Region  Moderate depletion  Temple Region  Moderate depletion  Clavicle Bone Region  Severe depletion  Clavicle and Acromion Bone Region  Severe depletion  Scapular Bone Region  Unable to assess  Dorsal Hand  Moderate depletion  Patellar Region   Moderate depletion  Anterior Thigh Region  Moderate depletion  Posterior Calf Region  Moderate depletion  Edema (RD Assessment)  Moderate [BLE]  Hair  Reviewed  Eyes  Reviewed  Mouth  Unable to assess  Skin  Reviewed  Nails  Reviewed       Diet Order:  Diet Heart Room service appropriate? Yes; Fluid consistency: Thin  EDUCATION NEEDS:   No education needs have been identified at this time  Skin:  Skin Assessment: Reviewed RN Assessment(ecchymosis to R and L arm)  Last BM:  01/29/18 large type 7  Height:   Ht Readings from Last 1 Encounters:  01/27/18 5\' 6"  (2.229 m)    Weight:   Wt Readings from Last 1 Encounters:  01/29/18 128 lb (58.1 kg)    Ideal Body Weight:  59.1 kg  BMI:  Body mass index is 20.66 kg/m.  Estimated Nutritional Needs:   Kcal:  1400-1600 kcal/day  Protein:  70-85 grams/day  Fluid:  1.4-1.6 L/day    Gaynell Face, MS, RD, LDN Pager: [phone] Weekend/After Hours: [phone]

## 2018-01-31 NOTE — Progress Notes (Signed)
ANTICOAGULATION CONSULT NOTE - Follow Up Consult  Pharmacy Consult for warfarin Indication: atrial fibrillation  Allergies  Allergen Reactions  . Azithromycin Other (See Comments)    unknown  . Ciprofloxacin     Felt like her head was on fire   Patient Measurements: Height: 5\' 6"  (167.6 cm) Weight: 128 lb (58.1 kg) IBW/kg (Calculated) : 59.3  Vital Signs: Temp: 97.7 F (36.5 C) (04/08 0646) Temp Source: Oral (04/08 0646) BP: 139/89 (04/08 0903) Pulse Rate: 98 (04/08 0903)  Labs: Recent Labs    01/29/18 0158 01/29/18 1721 01/30/18 0419 01/31/18 0328  HGB  --   --  11.9* 12.5  HCT  --   --  38.3 40.7  PLT  --   --  120* 100*  LABPROT 29.2*  --  34.9* 34.4*  INR 2.79  --  3.50 3.44  CREATININE  --  0.51 0.48 0.43*   Estimated Creatinine Clearance: 42 mL/min (A) (by C-G formula based on SCr of 0.43 mg/dL (L)).  Medical History: Past Medical History:  Diagnosis Date  . A-fib ([place])   . Lymphoma ([place])    Remission for ~5 years now per patient (looks more like 11 years remission by [facility] onc notes)   Assessment: 89 YOF on warfarin PTA for afib, presented to the ED with SOB and started on rocephin and doxycycline. Pharmacy is following for warfarin dosing.   PO intake not documented, but per RN is very little. No bleeding noted, Hgb stable. INR remains SUPRAtherapeutic at 3.44 today. Dose has been held x 2 days.  Drug interactions: Started on doxycycline 4/4>>  Home Regimen: Warfarin 1.5mg  daily   Goal of Therapy:  INR 2-3 Monitor platelets by anticoagulation protocol: Yes   Plan:  Hold warfarin again today Monitor daily INR, CBC, clinical course, s/sx of bleed, PO intake, DDI   Thank you for allowing [place] to participate in this patients care.  Jens Som, PharmD Clinical phone for 01/31/2018 from 7a-3:30p: [phone] If after 3:30p, please call main pharmacy at: [phone] 01/31/2018 11:05 AM

## 2018-01-31 NOTE — Care Management Important Message (Signed)
Important Message  Patient Details  Name: Judith Hunter MRN: [id number] Date of Birth: [birth year]   Medicare Important Message Given:  Yes    Orbie Pyo 01/31/2018, 2:32 PM

## 2018-01-31 NOTE — Progress Notes (Signed)
PROGRESS NOTE  Judith Hunter NWG:[id number] DOB: [birth year] DOA: 01/27/2018 PCP: Patient, No Pcp Per   LOS: 4 days   Brief Narrative / Interim history: 84 year old female with history of A. fib, lymphoma, presents to the emergency room with shortness of breath.  She was seen yesterday in her orthopedist office for back pain, she sounded congested and short of breath so she was sent to the ED.  Chest x-ray in the ED showed multilobar pneumonia, she was started on ceftriaxone and doxycycline and was admitted to the hospital  Assessment & Plan: Active Problems:   Sepsis due to pneumonia ([place])  Sepsis due to pneumonia -Started on ceftriaxone and doxycycline, continue -Cultures are pending -Scheduled DuoNeb's -sepsis physiology improved, still remains short of breath and coughing up mucus   Acute hypoxic respiratory failure  -multifactorial due to PNA and pulmonary edema  -started on IV lasix 4/7, breath sounds improving today, 2D echo is pending   Acute on chronic CHF, unknown type -no prior 2D echoes in the system, obtain a 2D echo, still pending today -placed on IV Lasix, closely monitor renal function   Hypokalemia -replete K today  Atrial fibrillation - patient's CHA2DS2-VASc Score for Stroke Risk is > 2, continue rate control with metoprolol as well as anticoagulation with Coumadin -increased her Metoprolol today given persistently elevated rates   DVT prophylaxis: On Coumadin Code Status: Full code, confirmed with patient Family Communication: No family at bedside, d/w son [name] over the phone  Disposition Plan: SNF when breathing improves   Consultants:   None   Procedures:   None   Antimicrobials:  Ceftriaxone 4/4 >>  Doxycycline 4/4 >>    Subjective: -feeling a bit better, no chest pain, some dyspnea but improved. No abdominal pain, nausea/vomiting    Objective: Vitals:   01/30/18 2152 01/30/18 2300 01/31/18 0646 01/31/18 0903  BP: (!) 155/105 131/76  137/74 139/89  Pulse: (!) 119 (!) 116 95 98  Resp:   17   Temp: 98 F (36.7 C)  97.7 F (36.5 C)   TempSrc: Oral  Oral   SpO2: 91% 90% 95%   Weight:      Height:        Intake/Output Summary (Last 24 hours) at 01/31/2018 1121 Last data filed at 01/31/2018 0100 Gross per 24 hour  Intake -  Output 1300 ml  Net -1300 ml   Filed Weights   01/27/18 1735 01/28/18 0454 01/29/18 0451  Weight: 54 kg (119 lb) 53.1 kg (117 lb) 58.1 kg (128 lb)    Examination:  Constitutional: NAD, appears tachypneic still  Eyes: no scleral icterus ENMT: moist mucous membranes  Respiratory: coarse breath sounds bilaterally, improved, crackles mainly on left, no wheezing  Cardiovascular: RRR without MRG, no edema, good peripheral pulses  Abdomen: soft, non tender, non distended, BS+ Neurologic: non focal, equal strength    Data Reviewed: I have independently reviewed following labs and imaging studies   CXR: increased CHF patters   CBC: Recent Labs  Lab 01/27/18 1040 01/28/18 0657 01/30/18 0419 01/31/18 0328  WBC 5.9 4.3 3.9* 6.0  NEUTROABS  --  3.3  --   --   HGB 12.9 11.6* 11.9* 12.5  HCT 38.4 36.0 38.3 40.7  MCV 93.4 96.5 99.0 97.6  PLT 145* 111* 120* 578*   Basic Metabolic Panel: Recent Labs  Lab 01/27/18 1040 01/27/18 1728 01/28/18 0657 01/29/18 1721 01/30/18 0419 01/31/18 0328  NA 134*  --  134* 136 138 138  K 3.1*  --  3.7 3.7 3.5 3.6  CL 91*  --  97* 94* 96* 96*  CO2 28  --  30 31 32 30  GLUCOSE 118*  --  93 106* 105* 115*  BUN 11  --  12 13 15 15   CREATININE 0.71  --  0.57 0.51 0.48 0.43*  CALCIUM 8.7*  --  8.6* 8.8* 8.7* 8.5*  MG  --  1.6*  --   --   --   --   PHOS  --  3.1  --   --   --   --    GFR: Estimated Creatinine Clearance: 42 mL/min (A) (by C-G formula based on SCr of 0.43 mg/dL (L)). Liver Function Tests: Recent Labs  Lab 01/27/18 1728  AST 38  ALT 17  ALKPHOS 245*  BILITOT 1.1  PROT 5.2*  ALBUMIN 2.8*   No results for input(s): LIPASE, AMYLASE  in the last 168 hours. No results for input(s): AMMONIA in the last 168 hours. Coagulation Profile: Recent Labs  Lab 01/27/18 1250 01/28/18 0657 01/29/18 0158 01/30/18 0419 01/31/18 0328  INR 1.85 2.01 2.79 3.50 3.44   Cardiac Enzymes: No results for input(s): CKTOTAL, CKMB, CKMBINDEX, TROPONINI in the last 168 hours. BNP (last 3 results) No results for input(s): PROBNP in the last 8760 hours. HbA1C: No results for input(s): HGBA1C in the last 72 hours. CBG: No results for input(s): GLUCAP in the last 168 hours. Lipid Profile: No results for input(s): CHOL, HDL, LDLCALC, TRIG, CHOLHDL, LDLDIRECT in the last 72 hours. Thyroid Function Tests: No results for input(s): TSH, T4TOTAL, FREET4, T3FREE, THYROIDAB in the last 72 hours. Anemia Panel: No results for input(s): VITAMINB12, FOLATE, FERRITIN, TIBC, IRON, RETICCTPCT in the last 72 hours. Urine analysis:    Component Value Date/Time   COLORURINE AMBER (A) 09/25/2017 0215   APPEARANCEUR HAZY (A) 09/25/2017 0215   LABSPEC 1.024 09/25/2017 0215   PHURINE 5.0 09/25/2017 0215   GLUCOSEU NEGATIVE 09/25/2017 0215   HGBUR NEGATIVE 09/25/2017 0215   BILIRUBINUR NEGATIVE 09/25/2017 0215   KETONESUR NEGATIVE 09/25/2017 0215   PROTEINUR NEGATIVE 09/25/2017 0215   NITRITE NEGATIVE 09/25/2017 0215   LEUKOCYTESUR MODERATE (A) 09/25/2017 0215   Sepsis Labs: Invalid input(s): PROCALCITONIN, LACTICIDVEN  Recent Results (from the past 240 hour(s))  Blood Culture (routine x 2)     Status: None (Preliminary result)   Collection Time: 01/27/18 12:47 PM  Result Value Ref Range Status   Specimen Description BLOOD RIGHT ANTECUBITAL  Final   Special Requests   Final    BOTTLES DRAWN AEROBIC AND ANAEROBIC Blood Culture results may not be optimal due to an inadequate volume of blood received in culture bottles   Culture   Final    NO GROWTH 3 DAYS Performed at [facility] [address], [place], [place] [address]    Report Status  PENDING  Incomplete  Blood Culture (routine x 2)     Status: None (Preliminary result)   Collection Time: 01/27/18  1:05 PM  Result Value Ref Range Status   Specimen Description BLOOD RIGHT HAND  Final   Special Requests   Final    BOTTLES DRAWN AEROBIC AND ANAEROBIC Blood Culture results may not be optimal due to an inadequate volume of blood received in culture bottles   Culture   Final    NO GROWTH 3 DAYS Performed at [facility], [place] [address] [place], [place] [address]    Report Status PENDING  Incomplete  Radiology Studies: Dg Chest 2 View  Result Date: 01/29/2018 CLINICAL DATA:  Hypoxia.  Atrial fibrillation. EXAM: CHEST - 2 VIEW COMPARISON:  01/27/2018 FINDINGS: Radiographic worsening with enlarging effusions an worsened lower lobe atelectasis and or pneumonia. Diffuse edema pattern persists and is worsened. IMPRESSION: Worsening of congestive heart failure pattern with enlarging effusions and worsened lower lobe volume loss and/or infiltrate. Electronically Signed   By: Nelson Chimes M.D.   On: 01/29/2018 15:58     Scheduled Meds: . doxycycline  100 mg Oral Q12H  . furosemide  40 mg Intravenous Q12H  . guaiFENesin  1,200 mg Oral BID  . ipratropium  0.5 mg Nebulization TID  . levalbuterol  0.63 mg Nebulization TID  . metoprolol succinate  100 mg Oral BID  . Warfarin - Pharmacist Dosing Inpatient   Does not apply q1800   Continuous Infusions: . cefTRIAXone (ROCEPHIN)  IV Stopped (01/30/18 1330)     Marzetta Board, MD, PhD [facility] Pager [phone]  If 7PM-7AM, please contact night-coverage [email or web address] Password [facility] 01/31/2018, 11:21 AM

## 2018-01-31 NOTE — Progress Notes (Signed)
Spoke with pt's son, Shea Evans, regarding his conversation with patient over the phone. He is somewhat concerned regarding her lucidity, feeling that she was more confused and anxious than usual and made statements about "being kept in a basement or dungeon". He would like the daytime doctor to be informed regarding this. Having just assessed pt, informed her son that she is resting comfortably at this time and appears to be more oriented than his conversation with her. Assured him that the information would be passed on to be addressed. Will continue to monitor patient.

## 2018-02-01 ENCOUNTER — Inpatient Hospital Stay (HOSPITAL_COMMUNITY): Payer: Medicare Other

## 2018-02-01 DIAGNOSIS — Z515 Encounter for palliative care: Secondary | ICD-10-CM

## 2018-02-01 DIAGNOSIS — E43 Unspecified severe protein-calorie malnutrition: Secondary | ICD-10-CM

## 2018-02-01 DIAGNOSIS — I272 Pulmonary hypertension, unspecified: Secondary | ICD-10-CM

## 2018-02-01 DIAGNOSIS — Z7189 Other specified counseling: Secondary | ICD-10-CM

## 2018-02-01 LAB — BASIC METABOLIC PANEL
Anion gap: 13 (ref 5–15)
BUN: 11 mg/dL (ref 6–20)
CHLORIDE: 94 mmol/L — AB (ref 101–111)
CO2: 34 mmol/L — AB (ref 22–32)
CREATININE: 0.43 mg/dL — AB (ref 0.44–1.00)
Calcium: 8.8 mg/dL — ABNORMAL LOW (ref 8.9–10.3)
GFR calc non Af Amer: >60 mL/min (ref >60)
Glucose, Bld: 128 mg/dL — ABNORMAL HIGH (ref 65–99)
POTASSIUM: 3.3 mmol/L — AB (ref 3.5–5.1)
Sodium: 141 mmol/L (ref 135–145)

## 2018-02-01 LAB — CULTURE, BLOOD (ROUTINE X 2)
CULTURE: NO GROWTH
Culture: NO GROWTH

## 2018-02-01 LAB — PROTIME-INR
INR: 3.09
Prothrombin Time: 31.6 seconds — ABNORMAL HIGH (ref 11.4–15.2)

## 2018-02-01 MED ORDER — FUROSEMIDE 10 MG/ML IJ SOLN
40.0000 mg | Freq: Two times a day (BID) | INTRAMUSCULAR | Status: DC
  Administered 2018-02-01 – 2018-02-02 (×3): 40 mg via INTRAVENOUS
  Filled 2018-02-01 (×3): qty 4

## 2018-02-01 MED ORDER — POTASSIUM CHLORIDE CRYS ER 20 MEQ PO TBCR
40.0000 meq | EXTENDED_RELEASE_TABLET | Freq: Once | ORAL | Status: AC
  Administered 2018-02-01: 40 meq via ORAL
  Filled 2018-02-01: qty 2

## 2018-02-01 MED ORDER — WARFARIN 0.5 MG HALF TABLET
0.5000 mg | ORAL_TABLET | Freq: Once | ORAL | Status: AC
  Administered 2018-02-01: 0.5 mg via ORAL
  Filled 2018-02-01: qty 1

## 2018-02-01 NOTE — Progress Notes (Signed)
Pt had previously been on Venturi mask during day shift. Pt's meal tray was delivered, Pt wanted to try and eat. Mask was somewhat an issue. Attempted to put Pt back on HFNC at 7L of O2. Pt actually responded well with sats in 90s. Told son at bedside we would try this as long as sats remained good. Will continue to monitor Pt and adjust O2 as needed.

## 2018-02-01 NOTE — Progress Notes (Signed)
Physical Therapy Treatment Patient Details Name: Judith Hunter MRN: [id number] DOB: [birth year] Today's Date: 02/01/2018    History of Present Illness 84 yo female with onset of sepssis PNA and acute hypoxia was admitted and noted quite weak.  Lives alone with some assistance, has PMHx: lymphoma, a-fib, atherosclerosis, cardiomegaly, chronic respiratory failure.    PT Comments    Pt not feeling well, but she was able to tolerate a session aimed to get her upright for pulmonary toilet.  Pt needed more assist than on eval for transitions to EOB, transfers to [facility] and back including standing for peri-care.  Sats were maintained in the upper 80's and low 90's until pt pulled the mask off in sitting on the [facility] (briefly unable to tolerate mask).    Follow Up Recommendations  SNF     Equipment Recommendations  None recommended by PT    Recommendations for Other Services       Precautions / Restrictions Precautions Precautions: Fall    Mobility  Bed Mobility Overal bed mobility: Needs Assistance Bed Mobility: Supine to Sit;Sit to Supine     Supine to sit: Max assist Sit to supine: Max assist      Transfers Overall transfer level: Needs assistance Equipment used: 1 person hand held assist Transfers: Sit to/from [facility] Sit to Stand: Mod assist;Max assist Stand pivot transfers: Mod assist;Max assist       General transfer comment: pt having more trouble breathing at EOB and on the BSC--?back pain vs more likely collapsed posture  Ambulation/Gait             General Gait Details: unable   Stairs            Wheelchair Mobility    Modified Rankin (Stroke Patients Only)       Balance Overall balance assessment: Needs assistance   Sitting balance-Leahy Scale: Poor     Standing balance support: Bilateral upper extremity supported;During functional activity Standing balance-Leahy Scale: Poor Standing balance comment: stood at [facility] with  face to face assist for peri-care after toileting and then pivoted back to the bed.                            Cognition Arousal/Alertness: Awake/alert;Lethargic Behavior During Therapy: Flat affect Overall Cognitive Status: Within Functional Limits for tasks assessed                                        Exercises Other Exercises Other Exercises: hip/knee flexion/ext ROM exercise prior to mobiity    General Comments General comments (skin integrity, edema, etc.): Sats at 88-90% on non rebreather.  Sats actually improved initially propped at EOB for approx 10 min.  Pt then needed to go to the bathroom and it was on the [facility] that pt had more trouble breathing.  By the time pt return to supine HOB 30*  Sat'son face mask  at 82%.  It took approx 5 minutes to return to upper 80's.      Pertinent Vitals/Pain Pain Assessment: Faces Faces Pain Scale: Hurts even more Pain Location: L humerus with WB, back Pain Descriptors / Indicators: Aching;Discomfort;Grimacing;Moaning Pain Intervention(s): Limited activity within patient's tolerance;Repositioned    Home Living                      Prior Function  PT Goals (current goals can now be found in the care plan section) Acute Rehab PT Goals Patient Stated Goal: to get stronger and hurt less PT Goal Formulation: With patient Time For Goal Achievement: 02/12/18 Potential to Achieve Goals: Good Progress towards PT goals: Not progressing toward goals - comment(In a weaker state as of this treatment)    Frequency    Min 2X/week      PT Plan Current plan remains appropriate    Co-evaluation              AM-PAC PT "6 Clicks" Daily Activity  Outcome Measure  Difficulty turning over in bed (including adjusting bedclothes, sheets and blankets)?: Unable Difficulty moving from lying on back to sitting on the side of the bed? : Unable Difficulty sitting down on and standing up from a  chair with arms (e.g., wheelchair, bedside commode, etc,.)?: Unable Help needed moving to and from a bed to chair (including a wheelchair)?: A Lot Help needed walking in hospital room?: A Lot Help needed climbing 3-5 steps with a railing? : Total 6 Click Score: 8    End of Session   Activity Tolerance: Patient limited by fatigue;Patient limited by pain Patient left: in bed;with call bell/phone within reach;with family/visitor present Nurse Communication: Mobility status PT Visit Diagnosis: Unsteadiness on feet (R26.81);Other abnormalities of gait and mobility (R26.89);Muscle weakness (generalized) (M62.81);Pain Pain - part of body: Arm(back)     Time: 9024-0973 PT Time Calculation (min) (ACUTE ONLY): 52 min  Charges:  $Therapeutic Exercise: 8-22 mins $Therapeutic Activity: 8-22 mins $Self Care/Home Management: 8-22                    G Codes:       04-Feb-2018  Donnella Sham, PT [phone]  (pager)   Tessie Fass Danile Trier Feb 04, 2018, 5:55 PM

## 2018-02-01 NOTE — Progress Notes (Addendum)
PROGRESS NOTE  Judith Hunter NLZ:[id number] DOB: 1924/10/25 DOA: 01/27/2018 PCP: Patient, No Pcp Per   LOS: 5 days   Brief Narrative / Interim history: 84 year old female with history of A. fib, lymphoma, presents to the emergency room with shortness of breath.  She was seen yesterday in her orthopedist office for back pain, she sounded congested and short of breath so she was sent to the ED.  Chest x-ray in the ED showed multilobar pneumonia, she was started on ceftriaxone and doxycycline and was admitted to the hospital.  She was started on antibiotics however her respiratory status has continued to decline, repeat chest x-ray showed increased pulmonary edema and patient underwent diuresis with IV Lasix and a 2D echo which showed severe pulmonary hypertension, diastolic dysfunction and severe tricuspid regurgitation.  She has adequate diuresis with little to no peripheral edema however still remains fluid overloaded from pulmonary standpoint, palliative care was consulted on 4/9  Assessment & Plan: Active Problems:   Sepsis due to pneumonia ([place])   Protein-calorie malnutrition, severe  Sepsis due to pneumonia -Started on ceftriaxone and doxycycline, continue -Cultures are pending -Scheduled DuoNeb's -sepsis physiology improved, still remains short of breath and coughing up mucus   Acute hypoxic respiratory failure due to pulmonary edema -multifactorial due to PNA and pulmonary edema, worsening today requiring a facemask -started on IV lasix 4/7, continue to diurese, despite good output of about 4 L in the last 2 days chest x-ray today continues to show significant pulmonary edema and layering bilateral effusions  Acute on chronic diastolic CHF with severe pulmonary hypertension -2D echo done as below with normal EF of 55-60%, severely increased PA pressure, left-sided pleural effusion and moderate to severe regurgitation on the tricuspid valve -Continue diuresis  Hypokalemia -Replete  potassium again today, recheck in the morning  Atrial fibrillation - patient's CHA2DS2-VASc Score for Stroke Risk is > 2, continue rate control with metoprolol as well as anticoagulation with Coumadin -Metoprolol increased on 4/8, rates better today  Goals of care -Discussed with patient about CODE STATUS couple days ago, she is not ready to make a decision, discussed with son over the phone and he thinks that DNR is reasonable however he will talk to her regarding this, again had long discussion in the hallway regarding DNR status -She has severe pulmonary hypertension, diastolic heart failure, severe tricuspid regurgitation, and persistently fluid overloaded despite diuresis -She has slightly worsening hypoxia today -I have consulted palliative care as further goals of care discussions need to be held at this point   DVT prophylaxis: On Coumadin Code Status: Full code, confirmed with patient Family Communication: No family at bedside, d/w son Judith Hunter over the phone  Disposition Plan: SNF when breathing improves   Consultants:   None   Procedures:   2D ehco Study Conclusions - Left ventricle: The cavity size was normal. There was moderate concentric hypertrophy. Systolic function was normal. The estimated ejection fraction was in the range of 55% to 60%. Wall motion was normal; there were no regional wall motion abnormalities. - Aortic valve: There was mild regurgitation. - Left atrium: The atrium was severely dilated. - Right ventricle: The cavity size was moderately dilated. Systolic function was moderately reduced. - Right atrium: The atrium was massively dilated. - Tricuspid valve: There was moderate-severe regurgitation directed centrally. - Pulmonary arteries: Systolic pressure was severely increased. PA peak pressure: 84 mm Hg (S). - Pericardium, extracardiac: There was a left pleural effusion.  Impressions:  - Consider infiltrative cardiomyopathy, for example  amyloidosis.  Antimicrobials:  Ceftriaxone 4/4 >>  Doxycycline 4/4 >>    Subjective: -Complains of mild shortness of breath, no chest pain, no palpitations, no abdominal pain, nausea or vomiting  Objective: Vitals:   01/31/18 2145 02/01/18 0500 02/01/18 0525 02/01/18 0759  BP: (!) 159/90  (!) 152/76   Pulse: 99  92   Resp: (!) 26  20   Temp: 98.3 F (36.8 C)  98.5 F (36.9 C)   TempSrc: Oral  Oral   SpO2: 94%  91% 92%  Weight:  49.9 kg (110 lb)    Height:        Intake/Output Summary (Last 24 hours) at 02/01/2018 1219 Last data filed at 02/01/2018 1029 Gross per 24 hour  Intake 50 ml  Output 2000 ml  Net -1950 ml   Filed Weights   01/28/18 0454 01/29/18 0451 02/01/18 0500  Weight: 53.1 kg (117 lb) 58.1 kg (128 lb) 49.9 kg (110 lb)    Examination:  Constitutional: Appears more tachypneic today, alert Eyes: No scleral icterus, lids and conjunctivae normal ENMT: Moist mucous membranes Respiratory: Coarse breath sounds bilaterally, bilateral crackles, no wheezing Cardiovascular: Regular rate and rhythm, no peripheral edema, good pulses Abdomen: Soft, nontender, nondistended, bowel sounds positive Neurologic: Equal strength, nonfocal   Data Reviewed: I have independently reviewed following labs and imaging studies   Chest x-ray 4/9 fluid overload, vascular congestion, left-sided pleural effusion  CBC: Recent Labs  Lab 01/27/18 1040 01/28/18 0657 01/30/18 0419 01/31/18 0328  WBC 5.9 4.3 3.9* 6.0  NEUTROABS  --  3.3  --   --   HGB 12.9 11.6* 11.9* 12.5  HCT 38.4 36.0 38.3 40.7  MCV 93.4 96.5 99.0 97.6  PLT 145* 111* 120* 676*   Basic Metabolic Panel: Recent Labs  Lab 01/27/18 1728 01/28/18 0657 01/29/18 1721 01/30/18 0419 01/31/18 0328 02/01/18 1013  NA  --  134* 136 138 138 141  K  --  3.7 3.7 3.5 3.6 3.3*  CL  --  97* 94* 96* 96* 94*  CO2  --  30 31 32 30 34*  GLUCOSE  --  93 106* 105* 115* 128*  BUN  --  12 13 15 15 11   CREATININE  --  0.57  0.51 0.48 0.43* 0.43*  CALCIUM  --  8.6* 8.8* 8.7* 8.5* 8.8*  MG 1.6*  --   --   --   --   --   PHOS 3.1  --   --   --   --   --    GFR: Estimated Creatinine Clearance: 36.1 mL/min (A) (by C-G formula based on SCr of 0.43 mg/dL (L)). Liver Function Tests: Recent Labs  Lab 01/27/18 1728  AST 38  ALT 17  ALKPHOS 245*  BILITOT 1.1  PROT 5.2*  ALBUMIN 2.8*   No results for input(s): LIPASE, AMYLASE in the last 168 hours. No results for input(s): AMMONIA in the last 168 hours. Coagulation Profile: Recent Labs  Lab 01/28/18 0657 01/29/18 0158 01/30/18 0419 01/31/18 0328 02/01/18 0414  INR 2.01 2.79 3.50 3.44 3.09   Cardiac Enzymes: No results for input(s): CKTOTAL, CKMB, CKMBINDEX, TROPONINI in the last 168 hours. BNP (last 3 results) No results for input(s): PROBNP in the last 8760 hours. HbA1C: No results for input(s): HGBA1C in the last 72 hours. CBG: No results for input(s): GLUCAP in the last 168 hours. Lipid Profile: No results for input(s): CHOL, HDL, LDLCALC, TRIG, CHOLHDL, LDLDIRECT in the last 72 hours. Thyroid Function  Tests: No results for input(s): TSH, T4TOTAL, FREET4, T3FREE, THYROIDAB in the last 72 hours. Anemia Panel: No results for input(s): VITAMINB12, FOLATE, FERRITIN, TIBC, IRON, RETICCTPCT in the last 72 hours. Urine analysis:    Component Value Date/Time   COLORURINE AMBER (A) 09/25/2017 0215   APPEARANCEUR HAZY (A) 09/25/2017 0215   LABSPEC 1.024 09/25/2017 0215   PHURINE 5.0 09/25/2017 0215   GLUCOSEU NEGATIVE 09/25/2017 0215   HGBUR NEGATIVE 09/25/2017 0215   BILIRUBINUR NEGATIVE 09/25/2017 0215   KETONESUR NEGATIVE 09/25/2017 0215   PROTEINUR NEGATIVE 09/25/2017 0215   NITRITE NEGATIVE 09/25/2017 0215   LEUKOCYTESUR MODERATE (A) 09/25/2017 0215   Sepsis Labs: Invalid input(s): PROCALCITONIN, LACTICIDVEN  Recent Results (from the past 240 hour(s))  Blood Culture (routine x 2)     Status: None   Collection Time: 01/27/18 12:47 PM    Result Value Ref Range Status   Specimen Description BLOOD RIGHT ANTECUBITAL  Final   Special Requests   Final    BOTTLES DRAWN AEROBIC AND ANAEROBIC Blood Culture results may not be optimal due to an inadequate volume of blood received in culture bottles   Culture   Final    NO GROWTH 5 DAYS Performed at [facility] [address] [place], [place] [address]    Report Status 02/01/2018 FINAL  Final  Blood Culture (routine x 2)     Status: None   Collection Time: 01/27/18  1:05 PM  Result Value Ref Range Status   Specimen Description BLOOD RIGHT HAND  Final   Special Requests   Final    BOTTLES DRAWN AEROBIC AND ANAEROBIC Blood Culture results may not be optimal due to an inadequate volume of blood received in culture bottles   Culture   Final    NO GROWTH 5 DAYS Performed at [facility], [address] [place], [place] [address]    Report Status 02/01/2018 FINAL  Final  MRSA PCR Screening     Status: None   Collection Time: 01/31/18  9:52 AM  Result Value Ref Range Status   MRSA by PCR NEGATIVE NEGATIVE Final    Comment:        The GeneXpert MRSA Assay (FDA approved for NASAL specimens only), is one component of a comprehensive MRSA colonization surveillance program. It is not intended to diagnose MRSA infection nor to guide or monitor treatment for MRSA infections. Performed at [facility], [place] [address], [place], [place] [address]       Radiology Studies: Dg Chest Port 1 View  Result Date: 02/01/2018 CLINICAL DATA:  Sudden onset shortness of breath, respiratory distress EXAM: PORTABLE CHEST 1 VIEW COMPARISON:  01/29/2018 FINDINGS: Cardiomegaly. Diffuse bilateral airspace disease again noted, worsening since prior study. Moderate layering effusions. 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 IMPRESSION: Worsening bilateral airspace disease, likely worsening CHF. Moderate layering bilateral effusions.  Electronically Signed   By: Rolm Baptise M.D.   On: 02/01/2018 10:43     Scheduled Meds: . doxycycline  100 mg Oral Q12H  . furosemide  40 mg Intravenous Q12H  . guaiFENesin  1,200 mg Oral BID  . ipratropium  0.5 mg Nebulization TID  . levalbuterol  0.63 mg Nebulization TID  . metoprolol succinate  100 mg Oral BID  . potassium chloride  40 mEq Oral Once  . warfarin  0.5 mg Oral ONCE-1800  . Warfarin - Pharmacist Dosing Inpatient   Does not apply [phone]  Continuous Infusions: . cefTRIAXone (ROCEPHIN)  IV Stopped (01/31/18 1300)   Time spent: > 35 minutes, more than 50% involved in counseling / discussions with patient/son  Marzetta Board, MD, PhD [facility] Pager [phone]  If 7PM-7AM, please contact night-coverage [email or web address] Password [email or web address] 02/01/2018, 12:19 PM

## 2018-02-01 NOTE — Consult Note (Signed)
Consultation Note Date: 02/01/2018   Patient Name: Judith Hunter  DOB: [birth year]  MRN: [id number]  Age / Sex: 84 y.o., female  PCP: Patient, No Pcp Per Referring Physician: Caren Griffins, MD  Reason for Consultation: Establishing goals of care  HPI/Patient Profile: 84 y.o. female  with past medical history of a fib and lymphoma in [place] admitted on 01/27/2018 with shortness of breath. Chest x ray revealed multilobar pneumonia and pulmonary edema. Hospital course complicated with a fib RVR.   Echo done 4/8 and reveals severe pulmonary hypertension. CXR 4/9 reveals worsening CHF. Increasing oxygen demands.  Patient has experienced a gradual decline since rotator cuff surgery in November, was d/c'd to rehab then d/c'd home.   PMT consulted for [place].   Clinical Assessment and Goals of Care: I have reviewed medical records including EPIC notes, labs and imaging, assessed the patient and then met at the bedside along with patient and her son, Shea Evans,  to discuss diagnosis prognosis, GOC, EOL wishes, disposition and options.  I introduced Palliative Medicine as specialized medical care for people living with serious illness. It focuses on providing relief from the symptoms and stress of a serious illness. The goal is to improve quality of life for both the patient and the family.  I attempted to speak with the patient about her illness and prognosis. When I told her I was worried about her heart, she asks me "what else can we do to get me better". I attempted to explain she is on maximal therapy for improvement but despite aggressive medical interventions, her respiratory status is worsening. I attempted to elicit her desires if things were continue to worsen - she tells me "I'll have to think about that" and changes the subject to her family and appetite.  At this time, her son entered the room and I continued the conversation with him.  We  discussed a brief life review of the patient. She has 1 son and 2 grandsons. [name] describes her as a very private person - did not like home health services because she did not want anyone in her home. He tells me her faith is important to her and the only thing she has ever shared with him about her thoughts on death was "I'm not afraid to die, I'm just not ready yet".   We discussed her current illness and what it means in the larger context of her on-going co-morbidities.  Natural disease trajectory and expectations at EOL were discussed. [name] has spoken w/ Dr. Cruzita Lederer earlier and understands prognosis is poor. We discussed this further.  [name] tells me his desire for his mother is comfort and dignity. He would want her to be a DNR and if her respiratory status were to worsen, his wishes are for her to be made comfortable. However, he still believes she has decision making capability and wants her to make that decision.  The difference between aggressive medical intervention and comfort care was considered in light of the patient's goals of care. [name] is interested in comfort care, but wants his mother to make that decision.  After long discussion with [name], we rejoined the patient and again attempted to elicit goals of care. She remembers our conversation about how sick she is. She tells me she wants to continue aggressive treatment, but would NOT want to be resuscitated if it were to come to that. [name] agrees with plan.  Questions and concerns were addressed. The family was encouraged to call with questions or  concerns.   Primary Decision Maker PATIENT Son, Shea Evans, if patient unable to speak for herself  SUMMARY OF RECOMMENDATIONS   -change code status to DNR -if patient continues to decline, son interested in comfort care -treat the treatable for now -PMT will continue to see patient  Code Status/Advance Care Planning:  DNR   Symptom Management:   Per primary  team  Palliative Prophylaxis:   Aspiration, Frequent Pain Assessment and Oral Care  Additional Recommendations (Limitations, Scope, Preferences):  DNR  Prognosis:   Unable to determine - -poor prognosis, could be days, will reassess tomorrow  Discharge Planning: To Be Determined      Primary Diagnoses: Present on Admission: . Sepsis due to pneumonia [facility])   I have reviewed the medical record, interviewed the patient and family, and examined the patient. The following aspects are pertinent.  Past Medical History:  Diagnosis Date  . A-fib ([place])   . Lymphoma ([place])    Remission for ~5 years now per patient (looks more like 11 years remission by [facility] onc notes)   Social History   Socioeconomic History  . Marital status: Single    Spouse name: Not on file  . Number of children: Not on file  . Years of education: Not on file  . Highest education level: Not on file  Occupational History  . Not on file  Social Needs  . Financial resource strain: Not on file  . Food insecurity:    Worry: Not on file    Inability: Not on file  . Transportation needs:    Medical: Not on file    Non-medical: Not on file  Tobacco Use  . Smoking status: Never Smoker  . Smokeless tobacco: Never Used  Substance and Sexual Activity  . Alcohol use: No    Frequency: Never  . Drug use: No  . Sexual activity: Never  Lifestyle  . Physical activity:    Days per week: Not on file    Minutes per session: Not on file  . Stress: Not on file  Relationships  . Social connections:    Talks on phone: Not on file    Gets together: Not on file    Attends religious service: Not on file    Active member of club or organization: Not on file    Attends meetings of clubs or organizations: Not on file    Relationship status: Not on file  Other Topics Concern  . Not on file  Social History Narrative  . Not on file   History reviewed. No pertinent family history. Scheduled Meds: . doxycycline  100  mg Oral Q12H  . guaiFENesin  1,200 mg Oral BID  . ipratropium  0.5 mg Nebulization TID  . levalbuterol  0.63 mg Nebulization TID  . metoprolol succinate  100 mg Oral BID  . warfarin  0.5 mg Oral ONCE-1800  . Warfarin - Pharmacist Dosing Inpatient   Does not apply q1800   Continuous Infusions: . cefTRIAXone (ROCEPHIN)  IV Stopped (01/31/18 1300)   PRN Meds:.acetaminophen Allergies  Allergen Reactions  . Azithromycin Other (See Comments)    unknown  . Ciprofloxacin     Felt like her head was on fire   Review of Systems  Constitutional: Positive for activity change, appetite change and fatigue.  Respiratory: Positive for shortness of breath.   Gastrointestinal: Positive for nausea.  Neurological: Positive for weakness.    Physical Exam  Constitutional:  Cachectic, ill appearing, elderly female  HENT:  Head: Normocephalic  and atraumatic.  Cardiovascular: Normal rate. An irregularly irregular rhythm present.  Pulmonary/Chest: Accessory muscle usage present.  Skin: Skin is warm and dry.  Psychiatric: She is slowed.  Oriented, changes subject when conversation is difficult    Vital Signs: BP (!) 152/76 (BP Location: Right Arm)   Pulse 92   Temp 98.5 F (36.9 C) (Oral)   Resp 20   Ht '5\' 6"'  (1.676 m)   Wt 49.9 kg (110 lb)   SpO2 92%   BMI 17.75 kg/m  Pain Scale: 0-10   Pain Score: 0-No pain   SpO2: SpO2: 92 % O2 Device:SpO2: 92 % O2 Flow Rate: .O2 Flow Rate (L/min): 5 L/min  IO: Intake/output summary:   Intake/Output Summary (Last 24 hours) at 02/01/2018 1025 Last data filed at 02/01/2018 0944 Gross per 24 hour  Intake -  Output 2000 ml  Net -2000 ml    LBM: Last BM Date: 01/31/18 Baseline Weight: Weight: 54.4 kg (120 lb) Most recent weight: Weight: 49.9 kg (110 lb)     Palliative Assessment/Data: PPS 20%     Time In: 14:15 Time Out: 16:00 Time Total: 105 minutes Greater than 50%  of this time was spent counseling and coordinating care related to the  above assessment and plan.  Juel Burrow, DNP, AGNP-C Palliative Medicine Team [phone]

## 2018-02-01 NOTE — Clinical Social Work Note (Signed)
Clinical Social Work Assessment  Patient Details  Name: Judith Hunter MRN: [id number] Date of Birth: 1923/11/30  Date of referral:  02/01/18               Reason for consult:  Facility Placement                Permission sought to share information with:  Facility Sport and exercise psychologist, Family Supports Permission granted to share information::  Yes, Verbal Permission Granted  Name::     [place]::  SNFs  Relationship::  Son  Contact Information:  [phone]  Housing/Transportation Living arrangements for the past 2 months:  [place], [place] City of Information:  Patient, Adult Children Patient Interpreter Needed:  None Criminal Activity/Legal Involvement Pertinent to Current Situation/Hospitalization:  No - Comment as needed Significant Relationships:  Adult Children Lives with:  Self Do you feel safe going back to the place where you live?  No Need for family participation in patient care:  Yes (Comment)  Care giving concerns:  CSW received consult for possible SNF placement at time of discharge. CSW spoke with patient and her son regarding PT recommendation of SNF placement at time of discharge. Patient's son reported that patient lives alone with the help of a niece. Patient expressed understanding of PT recommendation and is agreeable to SNF placement at time of discharge. CSW to continue to follow and assist with discharge planning needs.   Social Worker assessment / plan:  CSW spoke with patient concerning possibility of rehab at [facility] before returning home.  Employment status:  Retired Forensic scientist:  Medicare PT Recommendations:  [place] / Referral to community resources:  [place]  Patient/Family's Response to care:  Patient recognizes need for rehab before returning home and is agreeable to a SNF. She and her son realize that she will be in Medicare copay days that will cost  $170/day. Patient discharged from the hospital to [facility] in December and she would like to return there again. CSW explained that they are not on our referral system and do not have access to the patients info, but son stated he has already set everything up for her to return.   Patient/Family's Understanding of and Emotional Response to Diagnosis, Current Treatment, and Prognosis:  Patient/family is realistic regarding therapy needs and expressed being hopeful for SNF placement. Patient expressed understanding of CSW role and discharge process as well as medical condition. No questions/concerns about plan or treatment.    Emotional Assessment Appearance:  Appears stated age Attitude/Demeanor/Rapport:  Lethargic Affect (typically observed):  Accepting, Appropriate Orientation:  Oriented to Self, Oriented to Place, Oriented to  Time, Oriented to Situation Alcohol / Substance use:  Not Applicable Psych involvement (Current and /or in the community):  No (Comment)  Discharge Needs  Concerns to be addressed:  Care Coordination Readmission within the last 30 days:  No Current discharge risk:  None Barriers to Discharge:  Continued Medical Work up   [employer], [place] 02/01/2018, 11:50 AM

## 2018-02-01 NOTE — Progress Notes (Signed)
ANTICOAGULATION CONSULT NOTE - Follow Up Consult  Pharmacy Consult for warfarin Indication: atrial fibrillation  Allergies  Allergen Reactions  . Azithromycin Other (See Comments)    unknown  . Ciprofloxacin     Felt like her head was on fire   Patient Measurements: Height: 5\' 6"  (167.6 cm) Weight: 110 lb (49.9 kg) IBW/kg (Calculated) : 59.3  Vital Signs: Temp: 98.5 F (36.9 C) (04/09 0525) Temp Source: Oral (04/09 0525) BP: 152/76 (04/09 0525) Pulse Rate: 92 (04/09 0525)  Labs: Recent Labs    01/29/18 1721 01/30/18 0419 01/31/18 0328 02/01/18 0414  HGB  --  11.9* 12.5  --   HCT  --  38.3 40.7  --   PLT  --  120* 100*  --   LABPROT  --  34.9* 34.4* 31.6*  INR  --  3.50 3.44 3.09  CREATININE 0.51 0.48 0.43*  --    Estimated Creatinine Clearance: 36.1 mL/min (A) (by C-G formula based on SCr of 0.43 mg/dL (L)).  Medical History: Past Medical History:  Diagnosis Date  . A-fib ([place])   . Lymphoma ([place])    Remission for ~5 years now per patient (looks more like 11 years remission by [facility] onc notes)   Assessment: 89 YOF on warfarin PTA for afib, presented to the ED with SOB and started on rocephin and doxycycline. Pharmacy is following for warfarin dosing.   PO intake 10% or less, but per RN is very little. No bleeding noted, Hgb stable. INR remains slightly SUPRAtherapeutic at 3.09 today. Dose has been held x 3 days.  Drug interactions: Started on doxycycline 4/4>>  Home Regimen: Warfarin 1.5mg  daily   Goal of Therapy:  INR 2-3 Monitor platelets by anticoagulation protocol: Yes   Plan:  Give warfarin 0.5 mg x 1 today Monitor daily INR, CBC, clinical course, s/sx of bleed, PO intake, DDI   Thank you for allowing [place] to participate in this patients care.  Jens Som, PharmD Clinical phone for 02/01/2018 from 7a-3:30p: [phone] If after 3:30p, please call main pharmacy at: [phone] 02/01/2018 8:14 AM

## 2018-02-02 DIAGNOSIS — A419 Sepsis, unspecified organism: Principal | ICD-10-CM

## 2018-02-02 DIAGNOSIS — J189 Pneumonia, unspecified organism: Secondary | ICD-10-CM

## 2018-02-02 DIAGNOSIS — J9601 Acute respiratory failure with hypoxia: Secondary | ICD-10-CM

## 2018-02-02 DIAGNOSIS — J811 Chronic pulmonary edema: Secondary | ICD-10-CM

## 2018-02-02 LAB — BASIC METABOLIC PANEL
Anion gap: 11 (ref 5–15)
BUN: 16 mg/dL (ref 6–20)
CHLORIDE: 94 mmol/L — AB (ref 101–111)
CO2: 39 mmol/L — AB (ref 22–32)
Calcium: 8.4 mg/dL — ABNORMAL LOW (ref 8.9–10.3)
Creatinine, Ser: 0.5 mg/dL (ref 0.44–1.00)
GFR calc Af Amer: >60 mL/min (ref >60)
GFR calc non Af Amer: >60 mL/min (ref >60)
Glucose, Bld: 101 mg/dL — ABNORMAL HIGH (ref 65–99)
POTASSIUM: 3.4 mmol/L — AB (ref 3.5–5.1)
SODIUM: 144 mmol/L (ref 135–145)

## 2018-02-02 LAB — PROTIME-INR
INR: 3.56
Prothrombin Time: 35.4 seconds — ABNORMAL HIGH (ref 11.4–15.2)

## 2018-02-02 MED ORDER — POTASSIUM CHLORIDE CRYS ER 20 MEQ PO TBCR
40.0000 meq | EXTENDED_RELEASE_TABLET | Freq: Once | ORAL | Status: AC
  Administered 2018-02-02: 40 meq via ORAL
  Filled 2018-02-02: qty 2

## 2018-02-02 MED ORDER — FUROSEMIDE 10 MG/ML IJ SOLN
40.0000 mg | Freq: Every day | INTRAMUSCULAR | Status: DC
  Administered 2018-02-03: 40 mg via INTRAVENOUS
  Filled 2018-02-02: qty 4

## 2018-02-02 NOTE — Care Management Note (Signed)
Case Management Note  Patient Details  Name: Livie Vanderhoof MRN: [id number] Date of Birth: 12-29-1923  Subjective/Objective:      Admitted with multilobular pneumonia, hx of  lymphoma, a-fib, atherosclerosis, cardiomegaly, chronic respiratory failure . From home alone. Has a son Shea Evans) that lives in [place].  Shearon Stalls [facility])     [phone]       Action/Plan: Per PT's evaluation/recommendation: SNF.CSW aware and managing disposition to facility when medically stable.  Expected Discharge Date:                  Expected Discharge Plan:  Skilled Nursing Facility  In-House Referral:  Clinical Social Work  Discharge planning Services  CM Consult  Post Acute Care Choice:   N/A Choice offered to:   N/A  DME Arranged:   N/A DME Agency:   N/A  HH Arranged:   N/A HH Agency:   N/A  Status of Service:  Completed, signed off  If discussed at [place] of Stay Meetings, dates discussed:    Additional Comments:  Sharin Mons, RN 02/02/2018, 11:23 AM

## 2018-02-02 NOTE — Progress Notes (Signed)
   02/02/18 0900  Clinical Encounter Type  Visited With Patient;Health care provider  Visit Type Initial  Referral From Family;Nurse  Consult/Referral To Chaplain   Responded to a SCC for HCPA.  Note stated son had the paperwork.  Patient was alone sitting up with some breakfast.  Patient seemed confused when I mentioned the [place] and was not sure when son was coming back.  In speaking with the Palliative provider in the hall she indicated that the son was looking for Legal Power of Oneta Rack and a Will and not a HCPA.  Will follow and support as needed. Chaplain Katherene Ponto

## 2018-02-02 NOTE — Evaluation (Signed)
Clinical/Bedside Swallow Evaluation Patient Details  Name: Judith Hunter MRN: [id number] Date of Birth: 02-Jan-1924  Today's Date: 02/02/2018 Time: SLP Start Time (ACUTE ONLY): 0825 SLP Stop Time (ACUTE ONLY): 0843 SLP Time Calculation (min) (ACUTE ONLY): 18 min  Past Medical History:  Past Medical History:  Diagnosis Date  . A-fib ([place])   . Lymphoma ([place])    Remission for ~5 years now per patient (looks more like 11 years remission by [facility] onc notes)   Past Surgical History:  Past Surgical History:  Procedure Laterality Date  . ORIF HUMERUS FRACTURE Left 09/28/2017   Procedure: CLOSED REDUCTION LEFT PROXIMAL HUMERUS FRACTURE;  Surgeon: Meredith Pel, MD;  Location: [facility];  Service: Orthopedics;  Laterality: Left;   HPI:  Judith Hunter is a 84 y.o. female with past medical history significant for atrial fibrillation, lymphoma presents emergency room with shortness of breath.  Patient went to see Dr. Marlou Sa her orthopedist for her issues with back pain.  On his assessment the patient was congested and short of breath so he had patient transported to the emergency room by ambulance.  Patient states she has felt a little short of breath for the last "few" days.  She is also had less oral intake over the last few days as well.  Patient denies any sick contacts.  No fevers or chills.  Denies any other symptoms. Chest x-ray shows multilobular pneumonia.  Patient started on doxycycline and Rocephin.  Found to have sepsis.  Hospitalist consulted for admission.   Assessment / Plan / Recommendation Clinical Impression  Pt presents with increased risk of aspiration given respiratory deficits. Pt was on 6 liters of O2 via [place] during bedside and was able to maintain O2 stats around 98% while consuming PO intake. Pt with minimal intake of regular solids and thin liquids via straw but was free of overt s/s of aspiration. Recommend continuing current diet and ST to follow briefly for diet tolerance in  light of respiratory compromise.  SLP Visit Diagnosis: Dysphagia, oropharyngeal phase (R13.12)    Aspiration Risk  Mild aspiration risk    Diet Recommendation Regular;Thin liquid   Liquid Administration via: Straw;Cup Medication Administration: Whole meds with liquid Supervision: Patient able to self feed;Staff to assist with self feeding;Full supervision/cueing for compensatory strategies Compensations: Minimize environmental distractions;Slow rate;Small sips/bites Postural Changes: Seated upright at 90 degrees    Other  Recommendations Oral Care Recommendations: Oral care BID   Follow up Recommendations Skilled Nursing facility      Frequency and Duration min 2x/week  2 weeks       Prognosis Prognosis for Safe Diet Advancement: Fair Barriers to Reach Goals: Severity of deficits(Respiratory decline)      Swallow Study   General Date of Onset: 01/27/18 HPI: Judith Hunter is a 84 y.o. female with past medical history significant for atrial fibrillation, lymphoma presents emergency room with shortness of breath.  Patient went to see Dr. Marlou Sa her orthopedist for her issues with back pain.  On his assessment the patient was congested and short of breath so he had patient transported to the emergency room by ambulance.  Patient states she has felt a little short of breath for the last "few" days.  She is also had less oral intake over the last few days as well.  Patient denies any sick contacts.  No fevers or chills.  Denies any other symptoms. Chest x-ray shows multilobular pneumonia.  Patient started on doxycycline and Rocephin.  Found to have sepsis.  Hospitalist consulted for admission. Type of Study: Bedside Swallow Evaluation Previous Swallow Assessment: none in chart Diet Prior to this Study: Regular;Thin liquids Temperature Spikes Noted: No Respiratory Status: Nasal cannula(6 liters va [place]) History of Recent Intubation: No Behavior/Cognition: Cooperative;Pleasant mood Oral  [place] Assessment: Within Functional Limits Oral Care Completed by SLP: Recent completion by staff Vision: Functional for self-feeding Self-Feeding Abilities: Needs assist Patient Positioning: Upright in bed Baseline Vocal Quality: Normal Volitional Cough: Strong;Weak Volitional Swallow: Able to elicit    Oral/Motor/Sensory Function Overall Oral Motor/Sensory Function: Within functional limits   Ice Chips Ice chips: Within functional limits Presentation: Spoon   Thin Liquid Thin Liquid: Within functional limits Presentation: Straw;Self Fed    Nectar Thick Nectar Thick Liquid: Not tested   Honey Thick Honey Thick Liquid: Not tested   Puree Puree: Within functional limits Presentation: Spoon   Solid   GO   Solid: Within functional limits Presentation: [place] 02/02/2018,12:09 PM

## 2018-02-02 NOTE — Progress Notes (Signed)
PROGRESS NOTE    Judith Hunter  NFA:[id number] DOB: 06-26-1924 DOA: 01/27/2018 PCP: Patient, No Pcp Per   Brief Narrative: Patient is a 84 year old female with past medical history of atrial fibrillation, lymphoma who presented to the emergency room with shortness of breath.  Patient was seen at her orthopedics office for back pain and she was found congested and  short of breath so she was sent to the emergency department.  Chest x-ray on the emergency department showed multilevel pneumonia and she was started on antibiotics.  Her respiratory status continued to decline, repeat chest x-ray showed increased pulmonary edema and patient is undergoing diuresis with IV Lasix.  2D echo shows severe pulmonary hypertension, diastolic dysfunction and severe tricuspid regurgitation.  Palliative care consulted on 4/9.    Assessment & Plan:   Active Problems:   Sepsis due to pneumonia ([place])   Protein-calorie malnutrition, severe   Pulmonary hypertension ([place])   Goals of care, counseling/discussion   Palliative care by specialist  Sepsis due to pneumonia: Currently on ceftriaxone and oxygen which we will continue.  Cultures are pending.  Continue bronchodilators.  Respiratory status has improved this morning.  Patient does not look dyspneic or coughing.  Continue supplemental oxygen as needed.  Acute hypoxic respiratory failure secondary to pulmonary edema: Could be multifactorial secondary to pneumonia and pulmonary edema.  Continues supplemental oxygen.  Saturating fine on nasal cannula.  Started on IV diuresis with Lasix.  Chest x-ray on 02/01/18 showed significant pulmonary edema and layering bilateral effusions. Since patient's respiratory status has improved and she does not have significant peripheral edema, IV Lasix has been changed to daily.  Acute on chronic diastolic CHF with severe pulmonary hypertension: Echo Showed ejection fraction of 55-60%, severely increased PA pressure, left-sided  pleural effusion and moderate to severe regurgitation . Continue diuresis.  Hypokalemia: Continue potassium supplementation.  A. fib: Rate is controlled.  On anticoagulants with Coumadin.  We will continue to monitor daily INR.patient's CHA2DS2-VASc Score for Stroke Risk is > 2  Deconditioning/multiple comorbidities/advanced age: Palliative care following. Patient is DNR.    DVT prophylaxis: Coumadin Code Status: DNR Family Communication: None present at the bedside today Disposition Plan: Skilled nursing facility after stabilization in the respiratory status and CHF.   Consultants: None  Procedures: Echocardiogram  Antimicrobials: Ceftriaxone and doxycycline since 4/4  Subjective: Patient seen and examined the bedside this morning.  Continues to remain weak.  But looks like her respiratory status is improved today.  She was alert and communicating.  No overnight fever.  Objective: Vitals:   02/01/18 2126 02/02/18 0515 02/02/18 0718 02/02/18 1335  BP:  (!) 159/90    Pulse: (!) 109 91    Resp: 20 18    Temp:      TempSrc:      SpO2: 93% 98% 98% 97%  Weight:  47.2 kg (104 lb)    Height:        Intake/Output Summary (Last 24 hours) at 02/02/2018 1356 Last data filed at 02/02/2018 1100 Gross per 24 hour  Intake 20 ml  Output 1450 ml  Net -1430 ml   Filed Weights   01/29/18 0451 02/01/18 0500 02/02/18 0515  Weight: 58.1 kg (128 lb) 49.9 kg (110 lb) 47.2 kg (104 lb)    Examination:  General exam: Chronically ill elderly female, cachectic, not in obvious distress HEENT:PERRL,Oral mucosa dry, Ear/Nose normal on gross exam Respiratory system: Bilateral decreased air entry, bilateral rhonchi bilateral crackles Cardiovascular system: S1 & S2 heard, RRR.  No JVD, murmurs, rubs, gallops or clicks. No pedal edema. Gastrointestinal system: Abdomen is nondistended, soft and nontender. No organomegaly or masses felt. Normal bowel sounds heard. Central nervous system: Alert but  not oriented. No focal neurological deficits. Extremities: No edema, no clubbing ,no cyanosis, distal peripheral pulses palpable. Skin: No rashes, lesions or ulcers,no icterus ,no pallor   Data Reviewed: I have personally reviewed following labs and imaging studies  CBC: Recent Labs  Lab 01/27/18 1040 01/28/18 0657 01/30/18 0419 01/31/18 0328  WBC 5.9 4.3 3.9* 6.0  NEUTROABS  --  3.3  --   --   HGB 12.9 11.6* 11.9* 12.5  HCT 38.4 36.0 38.3 40.7  MCV 93.4 96.5 99.0 97.6  PLT 145* 111* 120* 989*   Basic Metabolic Panel: Recent Labs  Lab 01/27/18 1728  01/29/18 1721 01/30/18 0419 01/31/18 0328 02/01/18 1013 02/02/18 0858  NA  --    < > 136 138 138 141 144  K  --    < > 3.7 3.5 3.6 3.3* 3.4*  CL  --    < > 94* 96* 96* 94* 94*  CO2  --    < > 31 32 30 34* 39*  GLUCOSE  --    < > 106* 105* 115* 128* 101*  BUN  --    < > 13 15 15 11 16   CREATININE  --    < > 0.51 0.48 0.43* 0.43* 0.50  CALCIUM  --    < > 8.8* 8.7* 8.5* 8.8* 8.4*  MG 1.6*  --   --   --   --   --   --   PHOS 3.1  --   --   --   --   --   --    < > = values in this interval not displayed.   GFR: Estimated Creatinine Clearance: 34.1 mL/min (by C-G formula based on SCr of 0.5 mg/dL). Liver Function Tests: Recent Labs  Lab 01/27/18 1728  AST 38  ALT 17  ALKPHOS 245*  BILITOT 1.1  PROT 5.2*  ALBUMIN 2.8*   No results for input(s): LIPASE, AMYLASE in the last 168 hours. No results for input(s): AMMONIA in the last 168 hours. Coagulation Profile: Recent Labs  Lab 01/29/18 0158 01/30/18 0419 01/31/18 0328 02/01/18 0414 02/02/18 0622  INR 2.79 3.50 3.44 3.09 3.56   Cardiac Enzymes: No results for input(s): CKTOTAL, CKMB, CKMBINDEX, TROPONINI in the last 168 hours. BNP (last 3 results) No results for input(s): PROBNP in the last 8760 hours. HbA1C: No results for input(s): HGBA1C in the last 72 hours. CBG: No results for input(s): GLUCAP in the last 168 hours. Lipid Profile: No results for  input(s): CHOL, HDL, LDLCALC, TRIG, CHOLHDL, LDLDIRECT in the last 72 hours. Thyroid Function Tests: No results for input(s): TSH, T4TOTAL, FREET4, T3FREE, THYROIDAB in the last 72 hours. Anemia Panel: No results for input(s): VITAMINB12, FOLATE, FERRITIN, TIBC, IRON, RETICCTPCT in the last 72 hours. Sepsis Labs: Recent Labs  Lab 01/27/18 1316 01/27/18 1540 01/27/18 1728 01/27/18 2126  LATICACIDVEN 1.99* 0.81 0.9 1.0    Recent Results (from the past 240 hour(s))  Blood Culture (routine x 2)     Status: None   Collection Time: 01/27/18 12:47 PM  Result Value Ref Range Status   Specimen Description BLOOD RIGHT ANTECUBITAL  Final   Special Requests   Final    BOTTLES DRAWN AEROBIC AND ANAEROBIC Blood Culture results may not be optimal due to an inadequate  volume of blood received in culture bottles   Culture   Final    NO GROWTH 5 DAYS Performed at [facility], [place] [address], [place], [place] [address]    Report Status 02/01/2018 FINAL  Final  Blood Culture (routine x 2)     Status: None   Collection Time: 01/27/18  1:05 PM  Result Value Ref Range Status   Specimen Description BLOOD RIGHT HAND  Final   Special Requests   Final    BOTTLES DRAWN AEROBIC AND ANAEROBIC Blood Culture results may not be optimal due to an inadequate volume of blood received in culture bottles   Culture   Final    NO GROWTH 5 DAYS Performed at [facility], [place] [address] [place], [place] [address]    Report Status 02/01/2018 FINAL  Final  MRSA PCR Screening     Status: None   Collection Time: 01/31/18  9:52 AM  Result Value Ref Range Status   MRSA by PCR NEGATIVE NEGATIVE Final    Comment:        The GeneXpert MRSA Assay (FDA approved for NASAL specimens only), is one component of a comprehensive MRSA colonization surveillance program. It is not intended to diagnose MRSA infection nor to guide or monitor treatment for MRSA infections. Performed at [facility], [place] [address] [place], [place] [address]          Radiology Studies: Dg Chest Port 1 View  Result Date: 02/01/2018 CLINICAL DATA:  Sudden onset shortness of breath, respiratory distress EXAM: PORTABLE CHEST 1 VIEW COMPARISON:  01/29/2018 FINDINGS: Cardiomegaly. Diffuse bilateral airspace disease again noted, worsening since prior study. Moderate layering effusions. 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 IMPRESSION: Worsening bilateral airspace disease, likely worsening CHF. Moderate layering bilateral effusions. Electronically Signed   By: Rolm Baptise M.D.   On: 02/01/2018 10:43        Scheduled Meds: . doxycycline  100 mg Oral Q12H  . [START ON 02/03/2018] furosemide  40 mg Intravenous Daily  . guaiFENesin  1,200 mg Oral BID  . ipratropium  0.5 mg Nebulization TID  . levalbuterol  0.63 mg Nebulization TID  . metoprolol succinate  100 mg Oral BID  . Warfarin - Pharmacist Dosing Inpatient   Does not apply q1800   Continuous Infusions: . cefTRIAXone (ROCEPHIN)  IV 1 g (02/02/18 1331)     LOS: 6 days    Time spent: 25 mins.More than 50% of that time was spent in counseling and/or coordination of care.      Shelly Coss, MD [facility] Pager [phone]  If 7PM-7AM, please contact night-coverage [email or web address] Password [email or web address] 02/02/2018, 1:56 PM

## 2018-02-02 NOTE — Progress Notes (Signed)
Daily Progress Note   Patient Name: Judith Hunter       Date: 02/02/2018 DOB: December 29, 1923  Age: 84 y.o. MRN#: [id number] Attending Physician: Shelly Coss, MD Primary Care Physician: Patient, No Pcp Per Admit Date: 01/27/2018  Reason for Consultation/Follow-up: Establishing goals of care and Hospice Evaluation  Subjective: Eating breakfast, tells me she feels a little better  Length of Stay: 6  Current Medications: Scheduled Meds:  . doxycycline  100 mg Oral Q12H  . furosemide  40 mg Intravenous Q12H  . guaiFENesin  1,200 mg Oral BID  . ipratropium  0.5 mg Nebulization TID  . levalbuterol  0.63 mg Nebulization TID  . metoprolol succinate  100 mg Oral BID  . Warfarin - Pharmacist Dosing Inpatient   Does not apply q1800    Continuous Infusions: . cefTRIAXone (ROCEPHIN)  IV Stopped (02/01/18 1414)    PRN Meds: acetaminophen  Physical Exam  Constitutional: She is oriented to person, place, and time.  Cachectic, ill appearing, elderly female   HENT:  Head: Normocephalic and atraumatic.  Cardiovascular: An irregularly irregular rhythm present.  Pulmonary/Chest: No accessory muscle usage. No respiratory distress. She has rhonchi in the right upper field and the left upper field.  Abdominal: Soft.  Neurological: She is alert and oriented to person, place, and time.  HOH  Skin: Skin is warm and dry.            Vital Signs: BP (!) 159/90 (BP Location: Right Arm)   Pulse 91   Temp 98.1 F (36.7 C) (Oral)   Resp 18   Ht '5\' 6"'  (1.676 m)   Wt 47.2 kg (104 lb)   SpO2 98%   BMI 16.79 kg/m  SpO2: SpO2: 98 % O2 Device: O2 Device: Nasal Cannula O2 Flow Rate: O2 Flow Rate (L/min): 6 L/min  Intake/output summary:   Intake/Output Summary (Last 24 hours) at 02/02/2018 0948 Last data filed  at 02/02/2018 8757 Gross per 24 hour  Intake 70 ml  Output 1200 ml  Net -1130 ml   LBM: Last BM Date: 01/31/18 Baseline Weight: Weight: 54.4 kg (120 lb) Most recent weight: Weight: 47.2 kg (104 lb)       Palliative Assessment/Data: PPS 40%    Flowsheet Rows     Most Recent Value  Intake Tab  Referral Department  Hospitalist  Unit at Time of Referral  Cardiac/Telemetry Unit  Palliative Care Primary Diagnosis  Cardiac  Date Notified  02/01/18  Palliative Care Type  New Palliative care  Reason for referral  Clarify Goals of Care  Date of Admission  01/27/18  Date first seen by Palliative Care  02/01/18  # of days Palliative referral response time  0 Day(s)  # of days IP prior to Palliative referral  5  Clinical Assessment  Palliative Performance Scale Score  20%  Psychosocial & Spiritual Assessment  Palliative Care Outcomes  Patient/Family meeting held?  Yes  Who was at the meeting?  patient and son  Palliative Care Outcomes  Clarified goals of care, Changed CPR status  Patient/Family wishes: Interventions discontinued/not started   Mechanical Ventilation  Palliative Care follow-up planned  Yes, Facility      Patient Active Problem List  Diagnosis Date Noted  . Protein-calorie malnutrition, severe 02/01/2018  . Pulmonary hypertension ([place])   . Goals of care, counseling/discussion   . Palliative care by specialist   . Sepsis due to pneumonia ([place]) 01/27/2018  . Pain   . Shoulder fracture, left 09/28/2017  . Closed fracture of head of left humerus 09/25/2017  . A-fib ([place]) 09/25/2017    Palliative Care Assessment & Plan   HPI: 84 y.o. female  with past medical history of a fib and lymphoma in [place] admitted on 01/27/2018 with shortness of breath. Chest x ray revealed multilobar pneumonia and pulmonary edema. Hospital course complicated with a fib RVR.   Echo done 4/8 and reveals severe pulmonary hypertension. CXR 4/9 reveals worsening CHF. Increasing oxygen  demands.  Patient has experienced a gradual decline since rotator cuff surgery in November, was d/c'd to rehab then d/c'd home.   PMT consulted for [place].   Assessment: Met w/patient at bedside. Requiring less oxygen today, more alert, less work of breathing. Sitting up eating breakfast and drinking coffee. Tells me she feels better. Questions and concerns addressed.  Called patient's son to provide update and discuss options moving forward. He tole me yesterday is interested in his mother being discharged to SNF in [place]. Attempted to call him twice, left voicemail - no return call. Hospice at facility would be appropriate and in line w/ goals of care. Need to discuss this w/son.  Recommendations/Plan:  I think patient may benefit from hospice support at SNF; however, son did not return calls so unable to confirm plan with son.   PMT will follow-up tomorrow  Treat the treatable, but DNR  If patient acutely decompensates, son interested in comfort care  Goals of Care and Additional Recommendations:  Limitations on Scope of Treatment: Avoid Hospitalization, DNR  Code Status:  DNR  Prognosis:   < 6 months d/t heart failure with limited activity, shortness of breath, pulmonary edema despite optimal therapy  Discharge Planning:  SNF possibly with hospice   Care plan was discussed with patient, son, bedside RN, and chaplain  Thank you for allowing the Palliative Medicine Team to assist in the care of this patient.   Time In: 1000 Time Out: 1030 Total Time 30 minutes Prolonged Time Billed  no       Greater than 50%  of this time was spent counseling and coordinating care related to the above assessment and plan.  Juel Burrow, DNP, AGNP-C Palliative Medicine Team Team Phone # [phone]

## 2018-02-02 NOTE — Progress Notes (Signed)
ANTICOAGULATION CONSULT NOTE - Follow Up Consult  Pharmacy Consult for warfarin Indication: atrial fibrillation  Allergies  Allergen Reactions  . Azithromycin Other (See Comments)    unknown  . Ciprofloxacin     Felt like her head was on fire   Patient Measurements: Height: 5\' 6"  (167.6 cm) Weight: 104 lb (47.2 kg) IBW/kg (Calculated) : 59.3  Vital Signs: Temp: 98.1 F (36.7 C) (04/09 2049) Temp Source: Oral (04/09 2049) BP: 159/90 (04/10 0515) Pulse Rate: 91 (04/10 0515)  Labs: Recent Labs    01/31/18 0328 02/01/18 0414 02/01/18 1013 02/02/18 0622  HGB 12.5  --   --   --   HCT 40.7  --   --   --   PLT 100*  --   --   --   LABPROT 34.4* 31.6*  --  35.4*  INR 3.44 3.09  --  3.56  CREATININE 0.43*  --  0.43*  --    Estimated Creatinine Clearance: 34.1 mL/min (A) (by C-G formula based on SCr of 0.43 mg/dL (L)).  Medical History: Past Medical History:  Diagnosis Date  . A-fib ([place])   . Lymphoma ([place])    Remission for ~5 years now per patient (looks more like 11 years remission by [facility] onc notes)   Assessment: 89 YOF on warfarin PTA for afib, presented to the ED with SOB and started on rocephin and doxycycline. Pharmacy is following for warfarin dosing.   PO intake has been 10% or less, but looks like maybe was 0% on 4/9. No bleeding noted. INR remains SUPRAtherapeutic and increased significantly after one 0.5 mg dose on 4/9. Up to 3.56 today. Dose was held 4/6-8.  Drug interactions: Started on doxycycline 4/4>>  Home Regimen: Warfarin 1.5mg  daily  Goal of Therapy:  INR 2-3 Monitor platelets by anticoagulation protocol: Yes   Plan:  Hold warfarin today Monitor daily INR, CBC, clinical course, s/sx of bleed, PO intake, DDI   Thank you for allowing [place] to participate in this patients care.  Jens Som, PharmD Clinical phone for 02/02/2018 from 7a-3:30p: [phone] If after 3:30p, please call main pharmacy at: [phone] 02/02/2018 8:43 AM

## 2018-02-03 DIAGNOSIS — J9601 Acute respiratory failure with hypoxia: Secondary | ICD-10-CM

## 2018-02-03 LAB — PROTIME-INR
INR: 4.12 — AB
PROTHROMBIN TIME: 39.6 s — AB (ref 11.4–15.2)

## 2018-02-03 LAB — BASIC METABOLIC PANEL
Anion gap: 11 (ref 5–15)
BUN: 15 mg/dL (ref 6–20)
CO2: 36 mmol/L — ABNORMAL HIGH (ref 22–32)
Calcium: 8.4 mg/dL — ABNORMAL LOW (ref 8.9–10.3)
Chloride: 95 mmol/L — ABNORMAL LOW (ref 101–111)
Creatinine, Ser: 0.42 mg/dL — ABNORMAL LOW (ref 0.44–1.00)
GFR calc Af Amer: >60 mL/min (ref >60)
GLUCOSE: 105 mg/dL — AB (ref 65–99)
Potassium: 3.3 mmol/L — ABNORMAL LOW (ref 3.5–5.1)
SODIUM: 142 mmol/L (ref 135–145)

## 2018-02-03 MED ORDER — POTASSIUM CHLORIDE CRYS ER 20 MEQ PO TBCR
40.0000 meq | EXTENDED_RELEASE_TABLET | Freq: Once | ORAL | Status: AC
  Administered 2018-02-03: 40 meq via ORAL
  Filled 2018-02-03: qty 2

## 2018-02-03 MED ORDER — GUAIFENESIN-DM 100-10 MG/5ML PO SYRP
5.0000 mL | ORAL_SOLUTION | ORAL | Status: DC | PRN
  Administered 2018-02-05: 5 mL via ORAL
  Filled 2018-02-03 (×2): qty 5

## 2018-02-03 MED ORDER — FUROSEMIDE 10 MG/ML IJ SOLN
40.0000 mg | Freq: Two times a day (BID) | INTRAMUSCULAR | Status: DC
  Administered 2018-02-03 – 2018-02-08 (×10): 40 mg via INTRAVENOUS
  Filled 2018-02-03 (×10): qty 4

## 2018-02-03 NOTE — Progress Notes (Addendum)
4pm-CSW discussed needing a different SNF with son that is willing to accept patient in copay days. Son states patient is unable to pay privately. Concern stated from SNFs is that patient is not skillable through Medicare since she was not able to walk with PT. Will follow up.  3:32pm-CSW left voicemail for son to discuss disposition plan.  Percell Locus Jaquari Reckner LCSW [phone]

## 2018-02-03 NOTE — Progress Notes (Signed)
Daily Progress Note   Patient Name: Judith Hunter       Date: 02/03/2018 DOB: [birth year]  Age: 84 y.o. MRN#: [id number] Attending Physician: Shelly Coss, MD Primary Care Physician: Patient, No Pcp Per Admit Date: 01/27/2018  Reason for Consultation/Follow-up: Establishing goals of care and Hospice Evaluation  Subjective: Tells me she feels better  Length of Stay: 7  Current Medications: Scheduled Meds:  . doxycycline  100 mg Oral Q12H  . furosemide  40 mg Intravenous Q12H  . guaiFENesin  1,200 mg Oral BID  . ipratropium  0.5 mg Nebulization TID  . levalbuterol  0.63 mg Nebulization TID  . metoprolol succinate  100 mg Oral BID  . Warfarin - Pharmacist Dosing Inpatient   Does not apply q1800    Continuous Infusions: . cefTRIAXone (ROCEPHIN)  IV Stopped (02/02/18 1401)    PRN Meds: acetaminophen, guaiFENesin-dextromethorphan  Physical Exam  Constitutional: She is oriented to person, place, and time.  Frail, elderly female  HENT:  Head: Normocephalic and atraumatic.  Cardiovascular: Normal rate.  Pulmonary/Chest: No accessory muscle usage. No respiratory distress.  Abdominal: Soft.  Musculoskeletal:       Right lower leg: She exhibits no edema.       Left lower leg: She exhibits no edema.  Neurological: She is alert and oriented to person, place, and time.  Skin: Skin is warm and dry.  Psychiatric: She has a normal mood and affect. Her behavior is normal.            Vital Signs: BP 138/84 (BP Location: Left Arm)   Pulse 87   Temp 97.7 F (36.5 C) (Oral)   Resp 18   Ht '5\' 6"'  (1.676 m)   Wt 49.4 kg (109 lb)   SpO2 95%   BMI 17.59 kg/m  SpO2: SpO2: 95 % O2 Device: O2 Device: Nasal Cannula, High Flow Nasal Cannula O2 Flow Rate: O2 Flow Rate (L/min): 7 L/min  Intake/output  summary:   Intake/Output Summary (Last 24 hours) at 02/03/2018 1026 Last data filed at 02/03/2018 0500 Gross per 24 hour  Intake -  Output 1350 ml  Net -1350 ml   LBM: Last BM Date: 02/01/18 Baseline Weight: Weight: 54.4 kg (120 lb) Most recent weight: Weight: 49.4 kg (109 lb)       Palliative Assessment/Data: PPS 40%    Flowsheet Rows     Most Recent Value  Intake Tab  Referral Department  Hospitalist  Unit at Time of Referral  Cardiac/Telemetry Unit  Palliative Care Primary Diagnosis  Cardiac  Date Notified  02/01/18  Palliative Care Type  New Palliative care  Reason for referral  Clarify Goals of Care  Date of Admission  01/27/18  Date first seen by Palliative Care  02/01/18  # of days Palliative referral response time  0 Day(s)  # of days IP prior to Palliative referral  5  Clinical Assessment  Palliative Performance Scale Score  20%  Psychosocial & Spiritual Assessment  Palliative Care Outcomes  Patient/Family meeting held?  Yes  Who was at the meeting?  patient and son  Palliative Care Outcomes  Clarified goals of care, Changed CPR status  Patient/Family wishes: Interventions discontinued/not started  Mechanical Ventilation  Palliative Care follow-up planned  Yes, Facility      Patient Active Problem List   Diagnosis Date Noted  . Pulmonary edema 02/02/2018  . Acute respiratory failure with hypoxia ([place]) 02/02/2018  . Protein-calorie malnutrition, severe 02/01/2018  . Pulmonary hypertension ([place])   . Goals of care, counseling/discussion   . Palliative care by specialist   . Sepsis due to pneumonia ([place]) 01/27/2018  . Pain   . Shoulder fracture, left 09/28/2017  . Closed fracture of head of left humerus 09/25/2017  . A-fib ([place]) 09/25/2017    Palliative Care Assessment & Plan   HPI: 84 y.o.femalewith past medical history of a fib and lymphoma in resmissionadmitted on 4/4/2019with shortness of breath. Chest x ray revealed multilobar pneumonia and  pulmonary edema. Hospital course complicated with a fib RVR.  Echo done4/8 and reveals severe pulmonary hypertension. CXR 4/9 reveals worsening CHF. Increasing oxygen demands.  Patient has experienced a gradual decline since rotator cuff surgery in November, was d/c'd to rehab then d/c'd home.   PMT consulted for [place].  Assessment: Met w/patient at bedside. Still not needing face mask, more alert, less work of breathing. Sitting up eating breakfast and drinking coffee. Tells me she feels better.   We discussed possibility of Hospice at SNF. She tells me she doesn't like the word "hospice". We discussed their philosophy of care. We discussed shifting focus from aggressive medical interventions to comfort, quality of life, and dignity. We discussed avoiding hospitalizations. She agrees this is what she wants. Questions and concerns addressed.  Called patient's son to provide update and discuss options moving forward. He tells me his plan for her to go to a SNF in [place] is no longer appropriate and would like to pursue placement options in [place]. Hospice at facility would be appropriate and in line w/ goals of care. Need to discuss this w/son.  Spoke w/CSW - pt still in co-pay days for SNF, so though she is hospice appropriate - not a good financial option for family. Will rec palliative follow up at SNF.  Recommendations/Plan:  SNF w/palliative follow-up - please write for palliative follow up on discharge summary  Treat the treatable, but DNR  If patient acutely decompensates, son interested in comfort care  Need to complete MOST form with son if he is at bedside tomorrow  Goals of Care and Additional Recommendations:  Limitations on Scope of Treatment: Avoid Hospitalization, DNR  Code Status:  DNR  Prognosis:   < 6 months heart failure with limited activity, shortness of breath, pulmonary edema despite optimal therapy  Discharge Planning:  [place] for  rehab with Palliative care service follow-up  Care plan was discussed with patient and son  Thank you for allowing the Palliative Medicine Team to assist in the care of this patient.   Total Time 35 minutes Prolonged Time Billed  no       Greater than 50%  of this time was spent counseling and coordinating care related to the above assessment and plan.  Juel Burrow, DNP, AGNP-C Palliative Medicine Team Team Phone # [phone]

## 2018-02-03 NOTE — Progress Notes (Signed)
  Speech Language Pathology Treatment: Dysphagia  Patient Details Name: Makayela Secrest MRN: [id number] DOB: 1924-02-12 Today's Date: 02/03/2018 Time: 8677-3736 SLP Time Calculation (min) (ACUTE ONLY): 11 min  Assessment / Plan / Recommendation Clinical Impression  No s/s aspiration noted with soft muffin and thin liquids consumed without cueing needed re: po's.  Needed liquid wash due to dryness of muffin. General confusion noted. Continue regular texture, thin liquids. ST to sign off.    HPI HPI: Calirose Mccance is a 84 y.o. female with past medical history significant for atrial fibrillation, lymphoma presents emergency room with shortness of breath.  Patient went to see Dr. Marlou Sa her orthopedist for her issues with back pain.  On his assessment the patient was congested and short of breath so he had patient transported to the emergency room by ambulance.  Patient states she has felt a little short of breath for the last "few" days.  She is also had less oral intake over the last few days as well.  Patient denies any sick contacts.  No fevers or chills.  Denies any other symptoms. Chest x-ray shows multilobular pneumonia.  Patient started on doxycycline and Rocephin.  Found to have sepsis.  Hospitalist consulted for admission.      SLP Plan  All goals met;Discharge SLP treatment due to (comment)       Recommendations  Diet recommendations: Regular;Thin liquid Liquids provided via: Straw;Cup Medication Administration: Whole meds with liquid Supervision: Patient able to self feed;Intermittent supervision to cue for compensatory strategies Compensations: Minimize environmental distractions;Slow rate;Small sips/bites Postural Changes and/or Swallow Maneuvers: Seated upright 90 degrees                Oral Care Recommendations: Oral care BID Follow up Recommendations: Skilled Nursing facility SLP Visit Diagnosis: Dysphagia, unspecified (R13.10) Plan: All goals met;Discharge SLP treatment  due to (comment)       GO                Houston Siren 02/03/2018, 11:47 AM  Orbie Pyo Colvin Caroli.Ed [employer] [phone]

## 2018-02-03 NOTE — Progress Notes (Addendum)
Physical Therapy Treatment Patient Details Name: Judith Hunter MRN: [id number] DOB: 05/31/1924 Today's Date: 02/03/2018    History of Present Illness 84 yo female with onset of sepssis PNA and acute hypoxia was admitted and noted quite weak.  Lives alone with some assistance, has PMHx: lymphoma, a-fib, atherosclerosis, cardiomegaly, chronic respiratory failure.    PT Comments    Pt is making slow progress towards her goals today, however continues to be limited in her safe mobility by back pain and decreased strength and balance. Pt currently requires modA for supine to sit and maxA for sit to supine, modA for sit to stand transfers and modA side stepping towards HoB. PT continue to recommend SNF for rehab at d/c.    Follow Up Recommendations  SNF     Equipment Recommendations  None recommended by PT    Recommendations for Other Services       Precautions / Restrictions Precautions Precautions: Fall Restrictions Weight Bearing Restrictions: No    Mobility  Bed Mobility Overal bed mobility: Needs Assistance Bed Mobility: Supine to Sit;Sit to Supine     Supine to sit: Mod assist Sit to supine: Max assist   General bed mobility comments: pt able to move LE to EoB, vc to reach towards PT with R UE to pull her trunk to upright, requires pad scoot to EoB    Transfers Overall transfer level: Needs assistance Equipment used: Rolling walker (2 wheeled) Transfers: Sit to/from [facility] Sit to Stand: Mod assist         General transfer comment: modA for sit to stand with RW, utilized R UE on RW and required minA to maintain balance, Pt with c/o of increased pain in her back and request to get back to bed, Pt convinced to step towards HoB for proper positioning in bed  Ambulation/Gait Ambulation/Gait assistance: Mod assist Ambulation Distance (Feet): 2 Feet Assistive device: Rolling walker (2 wheeled) Gait Pattern/deviations: Step-to pattern Gait  velocity: slowed Gait velocity interpretation: Below normal speed for age/gender General Gait Details: pt able to take side steps towards HoB       Balance Overall balance assessment: Needs assistance   Sitting balance-Leahy Scale: Poor     Standing balance support: Bilateral upper extremity supported;During functional activity Standing balance-Leahy Scale: Poor Standing balance comment: requires R UE support and minA to maintain balance at RW                            Cognition Arousal/Alertness: Awake/alert;Lethargic Behavior During Therapy: Flat affect Overall Cognitive Status: Within Functional Limits for tasks assessed                                               Pertinent Vitals/Pain Pain Assessment: Faces Faces Pain Scale: Hurts whole lot Pain Location: L humerus with WB, back Pain Descriptors / Indicators: Aching;Discomfort;Grimacing;Moaning Pain Intervention(s): Limited activity within patient's tolerance;Monitored during session;Repositioned           PT Goals (current goals can now be found in the care plan section) Acute Rehab PT Goals Patient Stated Goal: to get stronger and hurt less PT Goal Formulation: With patient Time For Goal Achievement: 02/12/18 Potential to Achieve Goals: Good Progress towards PT goals: Progressing toward goals    Frequency    Min 2X/week  PT Plan Current plan remains appropriate       AM-PAC PT "6 Clicks" Daily Activity  Outcome Measure  Difficulty turning over in bed (including adjusting bedclothes, sheets and blankets)?: Unable Difficulty moving from lying on back to sitting on the side of the bed? : Unable Difficulty sitting down on and standing up from a chair with arms (e.g., wheelchair, bedside commode, etc,.)?: Unable Help needed moving to and from a bed to chair (including a wheelchair)?: A Lot Help needed walking in hospital room?: A Lot Help needed climbing 3-5 steps  with a railing? : Total 6 Click Score: 8    End of Session Equipment Utilized During Treatment: Gait belt;Oxygen Activity Tolerance: Patient limited by fatigue;Patient limited by pain Patient left: in bed;with call bell/phone within reach;with family/visitor present Nurse Communication: Mobility status PT Visit Diagnosis: Unsteadiness on feet (R26.81);Other abnormalities of gait and mobility (R26.89);Muscle weakness (generalized) (M62.81);Pain Pain - part of body: Arm(back)     Time: 4481-8563 PT Time Calculation (min) (ACUTE ONLY): 30 min  Charges:  $Therapeutic Activity: 23-37 mins                    G Codes:       Horst Ostermiller B. Migdalia Dk PT, DPT Acute Rehabilitation  [phone] Pager [phone]     [place] 02/03/2018, 5:00 PM

## 2018-02-03 NOTE — Progress Notes (Signed)
PROGRESS NOTE    Judith Hunter  ZHY:[id number] DOB: Nov 04, 1923 DOA: 01/27/2018 PCP: Patient, No Pcp Per   Brief Narrative: Patient is a 84 year old female with past medical history of atrial fibrillation, lymphoma who presented to the emergency room with shortness of breath.  Patient was seen at her orthopedics office for back pain and she was found congested and  short of breath so she was sent to the emergency department.  Chest x-ray on the emergency department showed multilevel pneumonia and she was started on antibiotics.  Her respiratory status continued to decline, repeat chest x-ray showed increased pulmonary edema and patient is undergoing diuresis with IV Lasix.  2D echo shows severe pulmonary hypertension, diastolic dysfunction and severe tricuspid regurgitation.  Palliative care consulted on 4/9.    Assessment & Plan:   Principal Problem:   Acute respiratory failure with hypoxia (HCC) Active Problems:   Sepsis due to pneumonia ([place])   Protein-calorie malnutrition, severe   Pulmonary hypertension ([place])   Goals of care, counseling/discussion   Palliative care by specialist   Pulmonary edema  Sepsis due to pneumonia: Currently on ceftriaxone and doxyxycline which we will continue.  Cultures NGTD.  Continue bronchodilators.  Respiratory status has improved this morning.  Patient does not look dyspneic or coughing.  Continue supplemental oxygen as needed.  Acute hypoxic respiratory failure secondary to pulmonary edema: Could be multifactorial secondary to pneumonia and pulmonary edema.  Continues supplemental oxygen.  Saturating fine on nasal cannula.  Started on IV diuresis with Lasix.  Chest x-ray on 02/01/18 showed significant pulmonary edema and layering bilateral effusions. Patient's respiratory status has improved .  Acute on chronic diastolic CHF with severe pulmonary hypertension: Echo Showed ejection fraction of 55-60%, severely increased PA pressure, left-sided pleural  effusion and moderate to severe regurgitation . Continue diuresis.  Hypokalemia: Continue potassium supplementation.  A. fib: Rate is controlled.  On anticoagulants with Coumadin.  We will continue to monitor daily INR.patient's CHA2DS2-VASc Score for Stroke Risk is > 2 INR more than 4 today.  Continue to monitor INR.  Deconditioning/multiple comorbidities/advanced age: Palliative care following. Patient is DNR.    DVT prophylaxis: Coumadin Code Status: DNR Family Communication: Called son on the cell phone. Call not received Disposition Plan: Skilled nursing facility after stabilization in the respiratory status and CHF.Likely take 1-2 days more   Consultants: None  Procedures: Echocardiogram  Antimicrobials: Ceftriaxone and doxycycline since 4/4  Subjective: Patient seen and examined the bedside this morning.  She appears much better today.  Respiratory status looks stable.  She still has bilateral crackles on examination.  Currently she is alert and oriented and communicates.  Objective: Vitals:   02/02/18 2131 02/03/18 0521 02/03/18 0819 02/03/18 0918  BP:  (!) 149/77  138/84  Pulse:  79  87  Resp:  18    Temp:  97.7 F (36.5 C)    TempSrc:  Oral    SpO2: 97% 99% 95%   Weight:  49.4 kg (109 lb)    Height:        Intake/Output Summary (Last 24 hours) at 02/03/2018 1259 Last data filed at 02/03/2018 0500 Gross per 24 hour  Intake -  Output 1100 ml  Net -1100 ml   Filed Weights   02/01/18 0500 02/02/18 0515 02/03/18 0521  Weight: 49.9 kg (110 lb) 47.2 kg (104 lb) 49.4 kg (109 lb)    Examination:  General exam: Appears calm and comfortable ,Not in obvious distress,average built, thin/cachectic HEENT:PERRL,Oral mucosa moist, Ear/Nose normal  on gross exam Respiratory system: Bilateral  crackles  Cardiovascular system: S1 & S2 heard, RRR. No JVD, murmurs, rubs, gallops or clicks. Gastrointestinal system: Abdomen is nondistended, soft and nontender. No organomegaly  or masses felt. Normal bowel sounds heard. Central nervous system: Alert and oriented. No focal neurological deficits. Extremities: No edema, no clubbing ,no cyanosis, distal peripheral pulses palpable. Skin: No rashes, lesions or ulcers,no icterus ,no pallor   Data Reviewed: I have personally reviewed following labs and imaging studies  CBC: Recent Labs  Lab 01/28/18 0657 01/30/18 0419 01/31/18 0328  WBC 4.3 3.9* 6.0  NEUTROABS 3.3  --   --   HGB 11.6* 11.9* 12.5  HCT 36.0 38.3 40.7  MCV 96.5 99.0 97.6  PLT 111* 120* 626*   Basic Metabolic Panel: Recent Labs  Lab 01/27/18 1728  01/30/18 0419 01/31/18 0328 02/01/18 1013 02/02/18 0858 02/03/18 0450  NA  --    < > 138 138 141 144 142  K  --    < > 3.5 3.6 3.3* 3.4* 3.3*  CL  --    < > 96* 96* 94* 94* 95*  CO2  --    < > 32 30 34* 39* 36*  GLUCOSE  --    < > 105* 115* 128* 101* 105*  BUN  --    < > 15 15 11 16 15   CREATININE  --    < > 0.48 0.43* 0.43* 0.50 0.42*  CALCIUM  --    < > 8.7* 8.5* 8.8* 8.4* 8.4*  MG 1.6*  --   --   --   --   --   --   PHOS 3.1  --   --   --   --   --   --    < > = values in this interval not displayed.   GFR: Estimated Creatinine Clearance: 35.7 mL/min (A) (by C-G formula based on SCr of 0.42 mg/dL (L)). Liver Function Tests: Recent Labs  Lab 01/27/18 1728  AST 38  ALT 17  ALKPHOS 245*  BILITOT 1.1  PROT 5.2*  ALBUMIN 2.8*   No results for input(s): LIPASE, AMYLASE in the last 168 hours. No results for input(s): AMMONIA in the last 168 hours. Coagulation Profile: Recent Labs  Lab 01/30/18 0419 01/31/18 0328 02/01/18 0414 02/02/18 0622 02/03/18 0450  INR 3.50 3.44 3.09 3.56 4.12*   Cardiac Enzymes: No results for input(s): CKTOTAL, CKMB, CKMBINDEX, TROPONINI in the last 168 hours. BNP (last 3 results) No results for input(s): PROBNP in the last 8760 hours. HbA1C: No results for input(s): HGBA1C in the last 72 hours. CBG: No results for input(s): GLUCAP in the last 168  hours. Lipid Profile: No results for input(s): CHOL, HDL, LDLCALC, TRIG, CHOLHDL, LDLDIRECT in the last 72 hours. Thyroid Function Tests: No results for input(s): TSH, T4TOTAL, FREET4, T3FREE, THYROIDAB in the last 72 hours. Anemia Panel: No results for input(s): VITAMINB12, FOLATE, FERRITIN, TIBC, IRON, RETICCTPCT in the last 72 hours. Sepsis Labs: Recent Labs  Lab 01/27/18 1316 01/27/18 1540 01/27/18 1728 01/27/18 2126  LATICACIDVEN 1.99* 0.81 0.9 1.0    Recent Results (from the past 240 hour(s))  Blood Culture (routine x 2)     Status: None   Collection Time: 01/27/18 12:47 PM  Result Value Ref Range Status   Specimen Description BLOOD RIGHT ANTECUBITAL  Final   Special Requests   Final    BOTTLES DRAWN AEROBIC AND ANAEROBIC Blood Culture results may not be optimal  due to an inadequate volume of blood received in culture bottles   Culture   Final    NO GROWTH 5 DAYS Performed at [facility], [place] [address] [place], [place] [address]    Report Status 02/01/2018 FINAL  Final  Blood Culture (routine x 2)     Status: None   Collection Time: 01/27/18  1:05 PM  Result Value Ref Range Status   Specimen Description BLOOD RIGHT HAND  Final   Special Requests   Final    BOTTLES DRAWN AEROBIC AND ANAEROBIC Blood Culture results may not be optimal due to an inadequate volume of blood received in culture bottles   Culture   Final    NO GROWTH 5 DAYS Performed at [facility], [place] [address], [place], [place] [address]    Report Status 02/01/2018 FINAL  Final  MRSA PCR Screening     Status: None   Collection Time: 01/31/18  9:52 AM  Result Value Ref Range Status   MRSA by PCR NEGATIVE NEGATIVE Final    Comment:        The GeneXpert MRSA Assay (FDA approved for NASAL specimens only), is one component of a comprehensive MRSA colonization surveillance program. It is not intended to diagnose MRSA infection nor to guide or monitor treatment for MRSA  infections. Performed at [facility], [place] [address] [place], [place] [address]          Radiology Studies: No results found.      Scheduled Meds: . doxycycline  100 mg Oral Q12H  . furosemide  40 mg Intravenous Q12H  . guaiFENesin  1,200 mg Oral BID  . ipratropium  0.5 mg Nebulization TID  . levalbuterol  0.63 mg Nebulization TID  . metoprolol succinate  100 mg Oral BID  . Warfarin - Pharmacist Dosing Inpatient   Does not apply q1800   Continuous Infusions: . cefTRIAXone (ROCEPHIN)  IV Stopped (02/02/18 1401)     LOS: 7 days    Time spent: 25 mins.More than 50% of that time was spent in counseling and/or coordination of care.      Shelly Coss, MD [facility] Pager [phone]  If 7PM-7AM, please contact night-coverage [email or web address] Password [facility] 02/03/2018, 12:59 PM

## 2018-02-03 NOTE — Plan of Care (Signed)
Per pt feeling better

## 2018-02-03 NOTE — Progress Notes (Signed)
ANTICOAGULATION CONSULT NOTE - Follow Up Consult  Pharmacy Consult for Coumadin Indication: atrial fibrillation  Allergies  Allergen Reactions  . Azithromycin Other (See Comments)    unknown  . Ciprofloxacin     Felt like her head was on fire    Patient Measurements: Height: 5\' 6"  (167.6 cm) Weight: 109 lb (49.4 kg) IBW/kg (Calculated) : 59.3 Heparin Dosing Weight:    Vital Signs: Temp: 97.7 F (36.5 C) (04/11 0521) Temp Source: Oral (04/11 0521) BP: 138/84 (04/11 0918) Pulse Rate: 87 (04/11 0918)  Labs: Recent Labs    02/01/18 0414 02/01/18 1013 02/02/18 0622 02/02/18 0858 02/03/18 0450  LABPROT 31.6*  --  35.4*  --  39.6*  INR 3.09  --  3.56  --  4.12*  CREATININE  --  0.43*  --  0.50 0.42*    Estimated Creatinine Clearance: 35.7 mL/min (A) (by C-G formula based on SCr of 0.42 mg/dL (L)).   Assessment: AC: Coumadin PTA for afib. INR 4.12 up again from change in diet vs intxn with Ceftriaxone. - Home Regimen:  Coumadin 1.5mg  daily.  Goal of Therapy:  INR 2-3 Monitor platelets by anticoagulation protocol: Yes   Plan:  Hold Coumadin tonight. Daily INR  Mj Willis S. Alford Highland, PharmD, [facility] Clinical Staff Pharmacist Pager [phone]  Eilene Ghazi [place]illing[place] 02/03/2018,10:50 AM

## 2018-02-03 NOTE — Discharge Instructions (Signed)

## 2018-02-04 LAB — BASIC METABOLIC PANEL
Anion gap: 11 (ref 5–15)
BUN: 14 mg/dL (ref 6–20)
CO2: 38 mmol/L — ABNORMAL HIGH (ref 22–32)
Calcium: 8.3 mg/dL — ABNORMAL LOW (ref 8.9–10.3)
Chloride: 92 mmol/L — ABNORMAL LOW (ref 101–111)
Creatinine, Ser: 0.52 mg/dL (ref 0.44–1.00)
GFR calc Af Amer: >60 mL/min (ref >60)
GLUCOSE: 94 mg/dL (ref 65–99)
POTASSIUM: 3.6 mmol/L (ref 3.5–5.1)
Sodium: 141 mmol/L (ref 135–145)

## 2018-02-04 LAB — PROTIME-INR
INR: 4.15 — AB
Prothrombin Time: 39.8 seconds — ABNORMAL HIGH (ref 11.4–15.2)

## 2018-02-04 MED ORDER — NYSTATIN 100000 UNIT/ML MT SUSP
5.0000 mL | Freq: Four times a day (QID) | OROMUCOSAL | Status: DC
  Administered 2018-02-04 – 2018-02-08 (×17): 500000 [IU] via ORAL
  Filled 2018-02-04 (×11): qty 5

## 2018-02-04 MED ORDER — POTASSIUM CHLORIDE CRYS ER 20 MEQ PO TBCR
20.0000 meq | EXTENDED_RELEASE_TABLET | Freq: Every day | ORAL | Status: DC
  Administered 2018-02-04 – 2018-02-07 (×4): 20 meq via ORAL
  Filled 2018-02-04 (×4): qty 1

## 2018-02-04 NOTE — Progress Notes (Signed)
Nurse mentioned this patient seems lonely and would likely appreciate visit.  I went to see her and she was glad to have visitor and held my hand tight when I had prayer with her.  She did not want to be left alone. Conard Novak, Chaplain   02/04/18 1400  Clinical Encounter Type  Visited With Patient  Visit Type Follow-up;Spiritual support  Referral From Nurse  Consult/Referral To Chaplain  Spiritual Encounters  Spiritual Needs Prayer;Emotional  Stress Factors  Patient Stress Factors Other (Comment) (lonely-didnt want to be left alone)  Family Stress Factors None identified

## 2018-02-04 NOTE — Progress Notes (Signed)
[place] [facility] if patient chooses to go to [facility]: [phone] A  CSW explained to son multiple times that patient is medically ready for discharge. [facility] is also able to accept patient in her copays with the understanding that patient will pay bill. Patient's son states he will come visit patient today to discuss with her. MD has also tried to contact son.   Percell Locus Jay Kempe LCSW [phone]

## 2018-02-04 NOTE — Progress Notes (Signed)
ANTICOAGULATION CONSULT NOTE - Follow Up Consult  Pharmacy Consult for Coumadin Indication: atrial fibrillation  Allergies  Allergen Reactions  . Azithromycin Other (See Comments)    unknown  . Ciprofloxacin     Felt like her head was on fire    Patient Measurements: Height: 5\' 6"  (167.6 cm) Weight: 102 lb (46.3 kg) IBW/kg (Calculated) : 59.3 Heparin Dosing Weight:    Vital Signs: Temp: 97.7 F (36.5 C) (04/12 0610) Temp Source: Oral (04/12 0610) BP: 137/74 (04/12 0925) Pulse Rate: 86 (04/12 0925)  Labs: Recent Labs    02/02/18 0622 02/02/18 0858 02/03/18 0450 02/04/18 0709  LABPROT 35.4*  --  39.6* 39.8*  INR 3.56  --  4.12* 4.15*  CREATININE  --  0.50 0.42* 0.52    Estimated Creatinine Clearance: 33.5 mL/min (by C-G formula based on SCr of 0.52 mg/dL).   Assessment: AC: Coumadin PTA for afib. INR 4.15 hopefully plateaued (from change in diet vs intxn with Ceftriaxone)  - Home Regimen:  Coumadin 1.5mg  daily.  Goal of Therapy:  INR 2-3 Monitor platelets by anticoagulation protocol: Yes   Plan:  Hold Coumadin tonight. Daily INR  Laydon Martis S. Alford Highland, PharmD, [facility] Clinical Staff Pharmacist Pager [phone]  Eilene Ghazi Stillinger 02/04/2018,10:50 AM

## 2018-02-04 NOTE — Progress Notes (Signed)
Daily Progress Note   Patient Name: Judith Hunter       Date: 02/04/2018 DOB: 02-14-1924  Age: 84 y.o. MRN#: [id number] Attending Physician: Shelly Coss, MD Primary Care Physician: Patient, No Pcp Per Admit Date: 01/27/2018  Reason for Consultation/Follow-up: Establishing goals of care and Hospice Evaluation  Subjective: Sleeping in bed. Still on nasal cannula.   Length of Stay: 8  Current Medications: Scheduled Meds:  . doxycycline  100 mg Oral Q12H  . furosemide  40 mg Intravenous Q12H  . guaiFENesin  1,200 mg Oral BID  . ipratropium  0.5 mg Nebulization TID  . levalbuterol  0.63 mg Nebulization TID  . metoprolol succinate  100 mg Oral BID  . nystatin  5 mL Oral QID  . Warfarin - Pharmacist Dosing Inpatient   Does not apply q1800    Continuous Infusions: . cefTRIAXone (ROCEPHIN)  IV Stopped (02/03/18 1437)    PRN Meds: acetaminophen, guaiFENesin-dextromethorphan  Physical Exam  Constitutional: She is oriented to person, place, and time.  Frail, elderly, female  HENT:  Head: Normocephalic and atraumatic.  Cardiovascular: Normal rate. An irregularly irregular rhythm present.  Pulmonary/Chest: No accessory muscle usage. No tachypnea. No respiratory distress.  Abdominal: Soft.  Musculoskeletal:       Right lower leg: She exhibits no edema.       Left lower leg: She exhibits no edema.  Neurological: She is alert and oriented to person, place, and time.  Skin: Skin is warm and dry.            Vital Signs: BP 137/74   Pulse 86   Temp 97.7 F (36.5 C) (Oral)   Resp 19   Ht _0  (1.676 m)   Wt 46.3 kg (102 lb)   SpO2 96%   BMI 16.46 kg/m  SpO2: SpO2: 96 % O2 Device: O2 Device: High Flow Nasal Cannula O2 Flow Rate: O2 Flow Rate (L/min): 7 L/min  Intake/output summary:    Intake/Output Summary (Last 24 hours) at 02/04/2018 1111 Last data filed at 02/04/2018 1104 Gross per 24 hour  Intake 220 ml  Output 700 ml  Net -480 ml   LBM: Last BM Date: 02/01/18 Baseline Weight: Weight: 54.4 kg (120 lb) Most recent weight: Weight: 46.3 kg (102 lb)       Palliative Assessment/Data: PPS 40%    Flowsheet Rows     Most Recent Value  Intake Tab  Referral Department  Hospitalist  Unit at Time of Referral  Cardiac/Telemetry Unit  Palliative Care Primary Diagnosis  Cardiac  Date Notified  02/01/18  Palliative Care Type  New Palliative care  Reason for referral  Clarify Goals of Care  Date of Admission  01/27/18  Date first seen by Palliative Care  02/01/18  # of days Palliative referral response time  0 Day(s)  # of days IP prior to Palliative referral  5  Clinical Assessment  Palliative Performance Scale Score  20%  Psychosocial & Spiritual Assessment  Palliative Care Outcomes  Patient/Family meeting held?  Yes  Who was at the meeting?  patient and son  Palliative Care Outcomes  Linked to palliative care logitudinal support  Patient/Family wishes: Interventions discontinued/not started   Mechanical  Ventilation  Palliative Care follow-up planned  Yes, Facility      Patient Active Problem List   Diagnosis Date Noted  . Pulmonary edema 02/02/2018  . Acute respiratory failure with hypoxia ([place]) 02/02/2018  . Protein-calorie malnutrition, severe 02/01/2018  . Pulmonary hypertension ([place])   . Goals of care, counseling/discussion   . Palliative care by specialist   . Sepsis due to pneumonia ([place]) 01/27/2018  . Pain   . Shoulder fracture, left 09/28/2017  . Closed fracture of head of left humerus 09/25/2017  . A-fib ([place]) 09/25/2017    Palliative Care Assessment & Plan   HPI: 84 y.o.femalewith past medical history of a fib and lymphoma in resmissionadmitted on 4/4/2019with shortness of breath. Chest x ray revealed multilobar pneumonia and  pulmonary edema. Hospital course complicated with a fib RVR.  Echo done4/8 and reveals severe pulmonary hypertension. CXR 4/9 reveals worsening CHF. Increasing oxygen demands.  Patient has experienced a gradual decline since rotator cuff surgery in November, was d/c'd to rehab then d/c'd home.   PMT consulted for [place].  Assessment: Met w/patient at bedside. Still not needing face mask, more alert, less work of breathing. She is resting well this morning.   Noted disposition issues. Patient/family goals are aligned with hospice, but not a good financial option for patient since she is in co-pay days for SNF. Home w/hospice is also not an option for this situation. Patient is not appropriate for residential hospice at this time. Will plan for SNF w/ palliative follow-up.  Completed DNR placed on chart.  Would like to complete MOST from with son if he is at bedside later today.   Recommendations/Plan:  SNF w/palliative follow-up - please write for palliative follow up on discharge summary  Treat the treatable, but DNR  If patient acutely decompensates, son interested in comfort care  Need to complete MOST form with son if he is at bedside  Goals of Care and Additional Recommendations:  Limitations on Scope of Treatment: Avoid Hospitalization, DNR  Code Status:  DNR  Prognosis:   < 6 months d/t heart failure with limited activity, shortness of breath, pulmonary edema despite optimal therapy  Discharge Planning:  [place] for rehab with Palliative care service follow-up  Care plan was discussed with patient  Thank you for allowing the Palliative Medicine Team to assist in the care of this patient.   Total Time 15 minutes Prolonged Time Billed  no       Greater than 50%  of this time was spent counseling and coordinating care related to the above assessment and plan.  Juel Burrow, DNP, AGNP-C Palliative Medicine Team Team Phone # [phone]

## 2018-02-04 NOTE — Progress Notes (Signed)
PROGRESS NOTE    Judith Hunter  RDE:[id number] DOB: October 03, 1924 DOA: 01/27/2018 PCP: Patient, No Pcp Per   Brief Narrative: Patient is a 84 year old female with past medical history of atrial fibrillation, lymphoma who presented to the emergency room with shortness of breath.  Patient was seen at her orthopedics office for back pain and she was found congested and  short of breath so she was sent to the emergency department.  Chest x-ray on the emergency department showed multilevel pneumonia and she was started on antibiotics.  Her respiratory status continued to decline, repeat chest x-ray showed increased pulmonary edema and patient is undergoing diuresis with IV Lasix.  2D echo shows severe pulmonary hypertension, diastolic dysfunction and severe tricuspid regurgitation.  Palliative care consulted on 4/9.    Assessment & Plan:   Principal Problem:   Acute respiratory failure with hypoxia ([facility]) Active Problems:   Sepsis due to pneumonia ([place])   Protein-calorie malnutrition, severe   Pulmonary hypertension ([place])   Goals of care, counseling/discussion   Palliative care by specialist   Pulmonary edema  Sepsis due to pneumonia: Finished course of  ceftriaxone and doxyxycline .  Cultures NGTD.  Continue bronchodilators.  Respiratory status has slowly improving.  Patient does not look dyspneic or coughing.  Continue supplemental oxygen as needed.  This morning she was high at 4 L/min.  Acute hypoxic respiratory failure secondary to pulmonary edema: Could be multifactorial secondary to pneumonia and pulmonary edema.  Continues supplemental oxygen.  Saturating fine on nasal cannula.  Started on IV diuresis with Lasix.  Chest x-ray on 02/01/18 showed significant pulmonary edema and layering bilateral effusions. Patient's respiratory status has improved .  Acute on chronic diastolic CHF with severe pulmonary hypertension: Echo Showed ejection fraction of 55-60%, severely increased PA pressure,  left-sided pleural effusion and moderate to severe regurgitation . Continue diuresis.  Hypokalemia: Continue potassium supplementation.  A. fib: Rate is controlled.  On anticoagulants with Coumadin.  We will continue to monitor daily INR.patient's CHA2DS2-VASc Score for Stroke Risk is > 2 INR more than 4 today.  Continue to monitor INR.Will hold warfarin.  Oral thrush: Started on nystatin  Deconditioning/multiple comorbidities/advanced age: Palliative care following. Patient is DNR.If she decompensates, her care will be transitioned to comfort as per discussion of palliative care and the son. Current plan is to discharge her to skilled nursing facility.  Social worker on board.  Social worker and palliative care discussing with the son.  The plan is to follow-up with palliative care at the skilled nursing facility. I called patient's son on his cell phone ,but he did not receive.  I will try again.      DVT prophylaxis: Coumadin Code Status: DNR Family Communication: Called son on the cell phone. Call not received Disposition Plan: Patient is medically stable to be discharged to skilled nursing facility as soon as the bed is available.   Consultants: None  Procedures: Echocardiogram  Antimicrobials: Ceftriaxone and doxycycline since 4/4  Subjective: Patient seen and examined the bedside this morning.  Continues to appear better.  Her mental status  is improving every day.  Looks comfortable. Objective: Vitals:   02/04/18 0721 02/04/18 0925 02/04/18 1301 02/04/18 1320  BP:  137/74 136/72 135/76  Pulse:  86 81 92  Resp:   20   Temp:   97.6 F (36.4 C) 97.7 F (36.5 C)  TempSrc:   Oral Oral  SpO2: 96%  98% 95%  Weight:      Height:  Intake/Output Summary (Last 24 hours) at 02/04/2018 1436 Last data filed at 02/04/2018 1104 Gross per 24 hour  Intake 220 ml  Output 700 ml  Net -480 ml   Filed Weights   02/02/18 0515 02/03/18 0521 02/04/18 0500  Weight: 47.2 kg  (104 lb) 49.4 kg (109 lb) 46.3 kg (102 lb)    Examination:  General exam:,Not in distress, very thin and cachectic, chronically ill elderly female HEENT:PERRL,Oral mucosa moist,oral thrush? Ear/Nose normal on gross exam Respiratory system: Bilateral decreased air entry, bilateral crackles Cardiovascular system: S1 & S2 heard, RRR. No JVD, murmurs, rubs, gallops or clicks. Gastrointestinal system: Abdomen is nondistended, soft and nontender. No organomegaly or masses felt. Normal bowel sounds heard. Central nervous system: Alert and oriented. No focal neurological deficits. Extremities: No edema, no clubbing ,no cyanosis, distal peripheral pulses palpable. Skin: No rashes, lesions or ulcers,no icterus ,no pallor     Data Reviewed: I have personally reviewed following labs and imaging studies  CBC: Recent Labs  Lab 01/30/18 0419 01/31/18 0328  WBC 3.9* 6.0  HGB 11.9* 12.5  HCT 38.3 40.7  MCV 99.0 97.6  PLT 120* 166*   Basic Metabolic Panel: Recent Labs  Lab 01/31/18 0328 02/01/18 1013 02/02/18 0858 02/03/18 0450 02/04/18 0709  NA 138 141 144 142 141  K 3.6 3.3* 3.4* 3.3* 3.6  CL 96* 94* 94* 95* 92*  CO2 30 34* 39* 36* 38*  GLUCOSE 115* 128* 101* 105* 94  BUN 15 11 16 15 14   CREATININE 0.43* 0.43* 0.50 0.42* 0.52  CALCIUM 8.5* 8.8* 8.4* 8.4* 8.3*   GFR: Estimated Creatinine Clearance: 33.5 mL/min (by C-G formula based on SCr of 0.52 mg/dL). Liver Function Tests: No results for input(s): AST, ALT, ALKPHOS, BILITOT, PROT, ALBUMIN in the last 168 hours. No results for input(s): LIPASE, AMYLASE in the last 168 hours. No results for input(s): AMMONIA in the last 168 hours. Coagulation Profile: Recent Labs  Lab 01/31/18 0328 02/01/18 0414 02/02/18 0622 02/03/18 0450 02/04/18 0709  INR 3.44 3.09 3.56 4.12* 4.15*   Cardiac Enzymes: No results for input(s): CKTOTAL, CKMB, CKMBINDEX, TROPONINI in the last 168 hours. BNP (last 3 results) No results for input(s):  PROBNP in the last 8760 hours. HbA1C: No results for input(s): HGBA1C in the last 72 hours. CBG: No results for input(s): GLUCAP in the last 168 hours. Lipid Profile: No results for input(s): CHOL, HDL, LDLCALC, TRIG, CHOLHDL, LDLDIRECT in the last 72 hours. Thyroid Function Tests: No results for input(s): TSH, T4TOTAL, FREET4, T3FREE, THYROIDAB in the last 72 hours. Anemia Panel: No results for input(s): VITAMINB12, FOLATE, FERRITIN, TIBC, IRON, RETICCTPCT in the last 72 hours. Sepsis Labs: No results for input(s): PROCALCITON, LATICACIDVEN in the last 168 hours.  Recent Results (from the past 240 hour(s))  Blood Culture (routine x 2)     Status: None   Collection Time: 01/27/18 12:47 PM  Result Value Ref Range Status   Specimen Description BLOOD RIGHT ANTECUBITAL  Final   Special Requests   Final    BOTTLES DRAWN AEROBIC AND ANAEROBIC Blood Culture results may not be optimal due to an inadequate volume of blood received in culture bottles   Culture   Final    NO GROWTH 5 DAYS Performed at [facility], [place] [address] [place], [place] [address]    Report Status 02/01/2018 FINAL  Final  Blood Culture (routine x 2)     Status: None   Collection Time: 01/27/18  1:05 PM  Result Value Ref Range Status   Specimen Description BLOOD RIGHT HAND  Final   Special Requests   Final    BOTTLES DRAWN AEROBIC AND ANAEROBIC Blood Culture results may not be optimal due to an inadequate volume of blood received in culture bottles   Culture   Final    NO GROWTH 5 DAYS Performed at [facility] [address] [place],  [place] [address]    Report Status 02/01/2018 FINAL  Final  MRSA PCR Screening     Status: None   Collection Time: 01/31/18  9:52 AM  Result Value Ref Range Status   MRSA by PCR NEGATIVE NEGATIVE Final    Comment:        The GeneXpert MRSA Assay (FDA approved for NASAL specimens only), is one component of a comprehensive MRSA colonization surveillance program.  It is not intended to diagnose MRSA infection nor to guide or monitor treatment for MRSA infections. Performed at [facility], [place] [address], [place], [place] [address]          Radiology Studies: No results found.      Scheduled Meds: . doxycycline  100 mg Oral Q12H  . furosemide  40 mg Intravenous Q12H  . guaiFENesin  1,200 mg Oral BID  . ipratropium  0.5 mg Nebulization TID  . levalbuterol  0.63 mg Nebulization TID  . metoprolol succinate  100 mg Oral BID  . nystatin  5 mL Oral QID  . Warfarin - Pharmacist Dosing Inpatient   Does not apply q1800   Continuous Infusions: . cefTRIAXone (ROCEPHIN)  IV Stopped (02/04/18 1356)     LOS: 8 days    Time spent: 25 mins.    Shelly Coss, MD [facility] Pager [phone]  If 7PM-7AM, please contact night-coverage [email or web address] Password [facility] 02/04/2018, 2:36 PM

## 2018-02-04 NOTE — Progress Notes (Signed)
CRITICAL VALUE ALERT  Critical Value:  INR 4.15  Date & Time Notied:  02/04/2018 at 0830  Provider Notified: Adhikari  Orders Received/Actions taken: Continue to monitor INR.

## 2018-02-04 NOTE — Progress Notes (Signed)
CSW has completed a search of SNFs. All are asking for payment upfront except [facility]. CSW let patient's son know so he can make a decision.   Percell Locus Antha Niday LCSW [phone]

## 2018-02-05 LAB — PROTIME-INR
INR: 4.82
Prothrombin Time: 44.8 seconds — ABNORMAL HIGH (ref 11.4–15.2)

## 2018-02-05 MED ORDER — PHYTONADIONE 5 MG PO TABS
5.0000 mg | ORAL_TABLET | Freq: Once | ORAL | Status: AC
  Administered 2018-02-05: 5 mg via ORAL
  Filled 2018-02-05: qty 1

## 2018-02-05 NOTE — Progress Notes (Signed)
CRITICAL VALUE ALERT  Critical Value:  INR  Date & Time Notied:  02/05/2018 0715  Provider Notified: Tawanna Solo  Orders Received/Actions taken: No new orders placed at this time.

## 2018-02-05 NOTE — Progress Notes (Signed)
PROGRESS NOTE    Judith Hunter  YTK:[id number] DOB: 1924-10-14 DOA: 01/27/2018 PCP: Patient, No Pcp Per   Brief Narrative: Patient is a 84 year old female with past medical history of atrial fibrillation, lymphoma who presented to the emergency room with shortness of breath.  Patient was seen at her orthopedics office for back pain and she was found congested and  short of breath so she was sent to the emergency department.  Chest x-ray on the emergency department showed multilevel pneumonia and she was started on antibiotics.  Her respiratory status continued to decline, repeat chest x-ray showed increased pulmonary edema and patient is undergoing diuresis with IV Lasix.  2D echo shows severe pulmonary hypertension, diastolic dysfunction and severe tricuspid regurgitation.  Palliative care consulted on 4/9. Patient has been planned for discharge to skilled nursing facility with palliative care to follow there.  But she is having insurance issues.    Assessment & Plan:   Principal Problem:   Acute respiratory failure with hypoxia (HCC) Active Problems:   Sepsis due to pneumonia ([place])   Protein-calorie malnutrition, severe   Pulmonary hypertension ([place])   Goals of care, counseling/discussion   Palliative care by specialist   Pulmonary edema  Sepsis due to pneumonia: Finished course of  ceftriaxone and doxyxycline .  Cultures NGTD.  Continue bronchodilators.  Respiratory status has slowly improving.  Patient does not look dyspneic or coughing.  Continue supplemental oxygen as needed.  This morning she was on oxygen  at 4 L/min.  Acute hypoxic respiratory failure secondary to pulmonary edema: Could be multifactorial secondary to pneumonia and pulmonary edema.  Continues supplemental oxygen.  Saturating fine on nasal cannula.  Started on IV diuresis with Lasix.  Chest x-ray on 02/01/18 showed significant pulmonary edema and layering bilateral effusions. Patient's respiratory status has improved  .  Acute on chronic diastolic CHF with severe pulmonary hypertension: Echo Showed ejection fraction of 55-60%, severely increased PA pressure, left-sided pleural effusion and moderate to severe regurgitation . Continue diuresis.  Hypokalemia: Continue potassium supplementation.  A. fib: Rate is controlled.  On anticoagulants with Coumadin.  We will continue to monitor daily INR.patient's CHA2DS2-VASc Score for Stroke Risk is > 2 INR more than 4 today.Given a dose of Vitamin K.  Continue to monitor INR.Will hold warfarin.  Oral thrush: Started on nystatin  Deconditioning/multiple comorbidities/advanced age: Palliative care following. Patient is DNR.If she decompensates, her care will be transitioned to comfort as per discussion of palliative care and the son. Current plan is to discharge her to skilled nursing facility.  Social worker on board.  Social worker ,case Freight forwarder discussing with the son.  The plan is to follow-up with palliative care at the skilled nursing facility. Currently we are having problems for discharge to skilled nursing facility due to her insurance, payment issues. Plan is to discuss further with son tomorrow.      DVT prophylaxis: Coumadin Code Status: DNR Family Communication: Discussed with son on 02/04/18 disposition Plan: Skilled nursing facility   Consultants: None  Procedures: Echocardiogram  Antimicrobials: Ceftriaxone and doxycycline since 4/4  Subjective: Patient seen and examined the bedside this morning.  Remains comfortable.  Continues to require oxygen for saturation maintenance.   Objective: Vitals:   02/05/18 0515 02/05/18 0946 02/05/18 1315 02/05/18 1417  BP: 121/73  125/72   Pulse: 81 86 71 82  Resp: 18 18 20 20   Temp: 97.9 F (36.6 C)  98.3 F (36.8 C)   TempSrc:   Oral   SpO2: 97%  95% 97% 98%  Weight: 47.6 kg (105 lb)     Height:        Intake/Output Summary (Last 24 hours) at 02/05/2018 1439 Last data filed at 02/05/2018  0520 Gross per 24 hour  Intake -  Output 1300 ml  Net -1300 ml   Filed Weights   02/03/18 0521 02/04/18 0500 02/05/18 0515  Weight: 49.4 kg (109 lb) 46.3 kg (102 lb) 47.6 kg (105 lb)    Examination:  General exam: Appears calm and comfortable ,Not in obvious distress, very thin and cachectic HEENT:PERRL,Oral mucosa moist, Ear/Nose normal on gross exam Respiratory system: Bilateral decreased air entry, coarse breathing sounds Cardiovascular system: S1 & S2 heard, RRR. No JVD, murmurs, rubs, gallops or clicks. Gastrointestinal system: Abdomen is nondistended, soft and nontender. No organomegaly or masses felt. Normal bowel sounds heard. Central nervous system: Alert and oriented. No focal neurological deficits. Extremities: No edema, no clubbing ,no cyanosis, distal peripheral pulses palpable. Skin: No rashes,no icterus ,no pallor,skin ulcers   Data Reviewed: I have personally reviewed following labs and imaging studies  CBC: Recent Labs  Lab 01/30/18 0419 01/31/18 0328  WBC 3.9* 6.0  HGB 11.9* 12.5  HCT 38.3 40.7  MCV 99.0 97.6  PLT 120* 956*   Basic Metabolic Panel: Recent Labs  Lab 01/31/18 0328 02/01/18 1013 02/02/18 0858 02/03/18 0450 02/04/18 0709  NA 138 141 144 142 141  K 3.6 3.3* 3.4* 3.3* 3.6  CL 96* 94* 94* 95* 92*  CO2 30 34* 39* 36* 38*  GLUCOSE 115* 128* 101* 105* 94  BUN 15 11 16 15 14   CREATININE 0.43* 0.43* 0.50 0.42* 0.52  CALCIUM 8.5* 8.8* 8.4* 8.4* 8.3*   GFR: Estimated Creatinine Clearance: 34.4 mL/min (by C-G formula based on SCr of 0.52 mg/dL). Liver Function Tests: No results for input(s): AST, ALT, ALKPHOS, BILITOT, PROT, ALBUMIN in the last 168 hours. No results for input(s): LIPASE, AMYLASE in the last 168 hours. No results for input(s): AMMONIA in the last 168 hours. Coagulation Profile: Recent Labs  Lab 02/01/18 0414 02/02/18 0622 02/03/18 0450 02/04/18 0709 02/05/18 0527  INR 3.09 3.56 4.12* 4.15* 4.82*   Cardiac  Enzymes: No results for input(s): CKTOTAL, CKMB, CKMBINDEX, TROPONINI in the last 168 hours. BNP (last 3 results) No results for input(s): PROBNP in the last 8760 hours. HbA1C: No results for input(s): HGBA1C in the last 72 hours. CBG: No results for input(s): GLUCAP in the last 168 hours. Lipid Profile: No results for input(s): CHOL, HDL, LDLCALC, TRIG, CHOLHDL, LDLDIRECT in the last 72 hours. Thyroid Function Tests: No results for input(s): TSH, T4TOTAL, FREET4, T3FREE, THYROIDAB in the last 72 hours. Anemia Panel: No results for input(s): VITAMINB12, FOLATE, FERRITIN, TIBC, IRON, RETICCTPCT in the last 72 hours. Sepsis Labs: No results for input(s): PROCALCITON, LATICACIDVEN in the last 168 hours.  Recent Results (from the past 240 hour(s))  Blood Culture (routine x 2)     Status: None   Collection Time: 01/27/18 12:47 PM  Result Value Ref Range Status   Specimen Description BLOOD RIGHT ANTECUBITAL  Final   Special Requests   Final    BOTTLES DRAWN AEROBIC AND ANAEROBIC Blood Culture results may not be optimal due to an inadequate volume of blood received in culture bottles   Culture   Final    NO GROWTH 5 DAYS Performed at [facility], [place] [address] [place], [place] [address]    Report Status 02/01/2018 FINAL  Final  Blood Culture (  routine x 2)     Status: None   Collection Time: 01/27/18  1:05 PM  Result Value Ref Range Status   Specimen Description BLOOD RIGHT HAND  Final   Special Requests   Final    BOTTLES DRAWN AEROBIC AND ANAEROBIC Blood Culture results may not be optimal due to an inadequate volume of blood received in culture bottles   Culture   Final    NO GROWTH 5 DAYS Performed at [facility], [place] [address] [place], [place] [address]    Report Status 02/01/2018 FINAL  Final  MRSA PCR Screening     Status: None   Collection Time: 01/31/18  9:52 AM  Result Value Ref Range Status   MRSA by PCR NEGATIVE NEGATIVE Final    Comment:        The  GeneXpert MRSA Assay (FDA approved for NASAL specimens only), is one component of a comprehensive MRSA colonization surveillance program. It is not intended to diagnose MRSA infection nor to guide or monitor treatment for MRSA infections. Performed at [facility], [place] [address] [place], [place] [address]          Radiology Studies: No results found.      Scheduled Meds: . furosemide  40 mg Intravenous Q12H  . guaiFENesin  1,200 mg Oral BID  . ipratropium  0.5 mg Nebulization TID  . levalbuterol  0.63 mg Nebulization TID  . metoprolol succinate  100 mg Oral BID  . nystatin  5 mL Oral QID  . potassium chloride  20 mEq Oral Daily  . Warfarin - Pharmacist Dosing Inpatient   Does not apply q1800   Continuous Infusions:    LOS: 9 days    Time spent: 25 mins.    Shelly Coss, MD [facility] Pager [phone]  If 7PM-7AM, please contact night-coverage [email or web address] Password [email or web address] 02/05/2018, 2:39 PM

## 2018-02-05 NOTE — Progress Notes (Signed)
ANTICOAGULATION CONSULT NOTE - Follow Up Consult  Pharmacy Consult for Coumadin Indication: atrial fibrillation  Allergies  Allergen Reactions  . Azithromycin Other (See Comments)    unknown  . Ciprofloxacin     Felt like her head was on fire    Patient Measurements: Height: 5\' 6"  (167.6 cm) Weight: 105 lb (47.6 kg) IBW/kg (Calculated) : 59.3 Heparin Dosing Weight:    Vital Signs: Temp: 97.9 F (36.6 C) (04/13 0515) BP: 121/73 (04/13 0515) Pulse Rate: 86 (04/13 0946)  Labs: Recent Labs    02/03/18 0450 02/04/18 0709 02/05/18 0527  LABPROT 39.6* 39.8* 44.8*  INR 4.12* 4.15* 4.82*  CREATININE 0.42* 0.52  --     Estimated Creatinine Clearance: 34.4 mL/min (by C-G formula based on SCr of 0.52 mg/dL).   Assessment: AC: Coumadin PTA for afib.INR 4.82, trending up, s/p 5mg  PO vitamin K - Home Regimen:  Coumadin 1.5mg  daily.  Goal of Therapy:  INR 2-3 Monitor platelets by anticoagulation protocol: Yes   Plan:  Hold Coumadin tonight. Daily INR  Maryanna Shape, PharmD, BCPS  Clinical Pharmacist  Pager: [phone]    Manley Mason 02/05/2018,12:25 PM

## 2018-02-05 NOTE — Care Management (Signed)
Discussed d/c plan with son, [name] at cell phone number listed in chart.  Son states that he has no idea what money his mother has and cannot make any decisions regarding plan until he speaks with her face to face tomorrow.  He states he has tried to talk to her on the phone about it and cannot get any information from her.  He states he does not think she can go home with [facility] or hired personal care assistant because her care is too intense. He wants to find out if the bill at [facility] can be paid and what money would be available beyond that.  He states his mother is very private/secretive about finances and has also been very conservative so he believes there is a possibility she money saved .  She avoids discussions regarding end-of-life decisions but son states he realizes we need an answer.  We discussed that Medicare will stop paying for hospital days when the patient is medically stable for d/c.  Son will call CM when he is here tomorrow to discuss further.

## 2018-02-05 NOTE — Progress Notes (Signed)
Pt had a run of 6 wide QRS. Pt asymptomatic. NP on call notified. Will continue to monitor.

## 2018-02-05 NOTE — Progress Notes (Signed)
CSW consulted with [facility] Caryl Pina concerning pt's disposition planning.  RNCM will speak with pt.for disposition.   Reed Breech LCSWA [phone]

## 2018-02-06 LAB — PROTIME-INR
INR: 1.4
Prothrombin Time: 17.1 seconds — ABNORMAL HIGH (ref 11.4–15.2)

## 2018-02-06 LAB — POTASSIUM: POTASSIUM: 2.8 mmol/L — AB (ref 3.5–5.1)

## 2018-02-06 LAB — MAGNESIUM: Magnesium: 1.4 mg/dL — ABNORMAL LOW (ref 1.7–2.4)

## 2018-02-06 MED ORDER — POTASSIUM CHLORIDE 10 MEQ/100ML IV SOLN
10.0000 meq | INTRAVENOUS | Status: AC
  Administered 2018-02-06 – 2018-02-07 (×4): 10 meq via INTRAVENOUS
  Filled 2018-02-06 (×4): qty 100

## 2018-02-06 MED ORDER — POTASSIUM CHLORIDE CRYS ER 20 MEQ PO TBCR
40.0000 meq | EXTENDED_RELEASE_TABLET | Freq: Once | ORAL | Status: AC
  Administered 2018-02-06: 40 meq via ORAL
  Filled 2018-02-06: qty 2

## 2018-02-06 MED ORDER — WARFARIN SODIUM 1 MG PO TABS
1.0000 mg | ORAL_TABLET | Freq: Once | ORAL | Status: AC
  Administered 2018-02-06: 1 mg via ORAL
  Filled 2018-02-06: qty 1

## 2018-02-06 MED ORDER — WARFARIN SODIUM 2 MG PO TABS
2.0000 mg | ORAL_TABLET | Freq: Once | ORAL | Status: DC
  Filled 2018-02-06: qty 1

## 2018-02-06 MED ORDER — MAGNESIUM SULFATE 2 GM/50ML IV SOLN
2.0000 g | Freq: Once | INTRAVENOUS | Status: AC
  Administered 2018-02-06: 2 g via INTRAVENOUS
  Filled 2018-02-06: qty 50

## 2018-02-06 NOTE — Progress Notes (Signed)
ANTICOAGULATION CONSULT NOTE - Follow Up Consult  Pharmacy Consult for Coumadin Indication: atrial fibrillation  Allergies  Allergen Reactions  . Azithromycin Other (See Comments)    unknown  . Ciprofloxacin     Felt like her head was on fire    Patient Measurements: Height: 5\' 6"  (167.6 cm) Weight: 107 lb (48.5 kg) IBW/kg (Calculated) : 59.3 Heparin Dosing Weight:    Vital Signs: Temp: 97.9 F (36.6 C) (04/14 1422) BP: 121/66 (04/14 1422) Pulse Rate: 57 (04/14 1422)  Labs: Recent Labs    02/04/18 0709 02/05/18 0527 02/06/18 0322  LABPROT 39.8* 44.8* 17.1*  INR 4.15* 4.82* 1.40  CREATININE 0.52  --   --     Estimated Creatinine Clearance: 35.1 mL/min (by C-G formula based on SCr of 0.52 mg/dL).   Assessment: 89 YOF on Coumadin PTA for afib. INR had been supratherapeutic  and trending up, s/p 5mg  PO vitamin K yesterday, INR is down to 1.4 today. MD ordered to restart coumadin 2mg  today. I feel this dose might be a little hight even after vitamin K yesterday. Pt only received 4.5 mg total since 4/4, and had been on doxycycline 4/4 >> 4/12  - Home Regimen:  Coumadin 1.5mg  daily.  Goal of Therapy:  INR 2-3 Monitor platelets by anticoagulation protocol: Yes   Plan:  Change coumadin to 1mg  tonight Daily INR  Maryanna Shape, PharmD, BCPS  Clinical Pharmacist  Pager: [phone]    Manley Mason 02/06/2018,2:24 PM

## 2018-02-06 NOTE — Care Management (Signed)
Pt's son, Shea Evans, came to discuss plan with patient today.  Pt informed son of possible financial solutions but son will have to go to patient's home in [place] to obtain information/assets.  Son's plan is to gain enough information and money to pay bill at [facility] and have patient transferred there.  Also reinforced the possibility of [facility] taking patient and not requiring payment until billed at the end of the month.  Son will call with update tomorrow.

## 2018-02-06 NOTE — Progress Notes (Signed)
Central tele called informed of run of 7 beats vtach. Patient is asymptomatic. Vitals obtained. 143/68, 82P, 97% 4L. MD notified and orders obtained.

## 2018-02-06 NOTE — Progress Notes (Addendum)
PROGRESS NOTE    Judith Hunter  VWU:[id number] DOB: 1924/09/18 DOA: 01/27/2018 PCP: Patient, No Pcp Per   Brief Narrative: Patient is a 84 year old female with past medical history of atrial fibrillation, lymphoma who presented to the emergency room with shortness of breath.  Patient was seen at her orthopedics office for back pain and she was found congested and  short of breath so she was sent to the emergency department.  Chest x-ray on the emergency department showed multilevel pneumonia and she was started on antibiotics.  Her respiratory status continued to decline, repeat chest x-ray showed increased pulmonary edema and patient is undergoing diuresis with IV Lasix.  2D echo shows severe pulmonary hypertension, diastolic dysfunction and severe tricuspid regurgitation.  Palliative care consulted on 4/9. Patient has been planned for discharge to skilled nursing facility with palliative care to follow there.  But she is having insurance issues.    Assessment & Plan:   Principal Problem:   Acute respiratory failure with hypoxia (HCC) Active Problems:   Sepsis due to pneumonia ([place])   Protein-calorie malnutrition, severe   Pulmonary hypertension ([place])   Goals of care, counseling/discussion   Palliative care by specialist   Pulmonary edema  Sepsis due to pneumonia: Finished course of  ceftriaxone and doxyxycline .  Cultures NGTD.  Continue bronchodilators.  Respiratory status has slowly improving.  Patient does not look dyspneic or coughing.  Continue supplemental oxygen as needed.  This morning she was on oxygen  at 4 L/min.  Acute hypoxic respiratory failure secondary to pulmonary edema: Could be multifactorial secondary to pneumonia and pulmonary edema.  Continues supplemental oxygen.  Saturating fine on nasal cannula.  Started on IV diuresis with Lasix.  Chest x-ray on 02/01/18 showed significant pulmonary edema and layering bilateral effusions. Patient's respiratory status has improved  .  Acute on chronic diastolic CHF with severe pulmonary hypertension: Echo Showed ejection fraction of 55-60%, severely increased PA pressure, left-sided pleural effusion and moderate to severe regurgitation . Continue diuresis.  Hypokalemia: Continue potassium supplementation.  A. fib: Rate is controlled.  On anticoagulants with Coumadin.  We will continue to monitor daily INR.patient's CHA2DS2-VASc Score for Stroke Risk is > 2 INR came down today. Continue to monitor INR.Warfarin as per pharmacy.  Oral thrush: Started on nystatin  Deconditioning/multiple comorbidities/advanced age: Palliative care following. Patient is DNR.If she decompensates, her care will be transitioned to comfort as per discussion of palliative care and the son. Current plan is to discharge her to skilled nursing facility.  Social worker on board.  Social worker ,case Freight forwarder discussing with the son.  The plan is to follow-up with palliative care at the skilled nursing facility. Currently we are having problems for discharge to skilled nursing facility due to her insurance, payment issues. Plan is to discuss further with son today.      DVT prophylaxis: Coumadin Code Status: DNR Family Communication: Discussed with son on 02/04/18 Disposition Plan: Skilled nursing facility as soon as bed is available   Consultants: None  Procedures: Echocardiogram  Antimicrobials: Ceftriaxone and doxycycline since 4/4  Subjective: Patient seen and examined the bedside this morning.  Remains comfortable.  Continues to require oxygen for saturation maintenance.   Objective: Vitals:   02/05/18 2124 02/06/18 0519 02/06/18 0735 02/06/18 1349  BP: (!) 138/93 127/82    Pulse: 87 81    Resp: 18 18    Temp: 97.9 F (36.6 C) (!) 97.5 F (36.4 C)    TempSrc: Oral  SpO2: 94% 99% 95% 95%  Weight:  48.5 kg (107 lb)    Height:        Intake/Output Summary (Last 24 hours) at 02/06/2018 1406 Last data filed at 02/06/2018  0519 Gross per 24 hour  Intake -  Output 1301 ml  Net -1301 ml   Filed Weights   02/04/18 0500 02/05/18 0515 02/06/18 0519  Weight: 46.3 kg (102 lb) 47.6 kg (105 lb) 48.5 kg (107 lb)    Examination:  General exam: Appears calm and comfortable ,Not in obvious distress, very thin and cachectic HEENT:PERRL,Oral mucosa moist, Ear/Nose normal on gross exam Respiratory system: Bilateral decreased air entry, coarse breathing sounds,basal crackels Cardiovascular system: S1 & S2 heard, RRR. No JVD, murmurs, rubs, gallops or clicks. Gastrointestinal system: Abdomen is nondistended, soft and nontender. No organomegaly or masses felt. Normal bowel sounds heard. Central nervous system: Alert and oriented. No focal neurological deficits. Extremities: No edema, no clubbing ,no cyanosis, distal peripheral pulses palpable. Skin: No rashes,no icterus ,no pallor,skin ulcers   Data Reviewed: I have personally reviewed following labs and imaging studies  CBC: Recent Labs  Lab 01/31/18 0328  WBC 6.0  HGB 12.5  HCT 40.7  MCV 97.6  PLT 094*   Basic Metabolic Panel: Recent Labs  Lab 01/31/18 0328 02/01/18 1013 02/02/18 0858 02/03/18 0450 02/04/18 0709  NA 138 141 144 142 141  K 3.6 3.3* 3.4* 3.3* 3.6  CL 96* 94* 94* 95* 92*  CO2 30 34* 39* 36* 38*  GLUCOSE 115* 128* 101* 105* 94  BUN 15 11 16 15 14   CREATININE 0.43* 0.43* 0.50 0.42* 0.52  CALCIUM 8.5* 8.8* 8.4* 8.4* 8.3*   GFR: Estimated Creatinine Clearance: 35.1 mL/min (by C-G formula based on SCr of 0.52 mg/dL). Liver Function Tests: No results for input(s): AST, ALT, ALKPHOS, BILITOT, PROT, ALBUMIN in the last 168 hours. No results for input(s): LIPASE, AMYLASE in the last 168 hours. No results for input(s): AMMONIA in the last 168 hours. Coagulation Profile: Recent Labs  Lab 02/02/18 0622 02/03/18 0450 02/04/18 0709 02/05/18 0527 02/06/18 0322  INR 3.56 4.12* 4.15* 4.82* 1.40   Cardiac Enzymes: No results for  input(s): CKTOTAL, CKMB, CKMBINDEX, TROPONINI in the last 168 hours. BNP (last 3 results) No results for input(s): PROBNP in the last 8760 hours. HbA1C: No results for input(s): HGBA1C in the last 72 hours. CBG: No results for input(s): GLUCAP in the last 168 hours. Lipid Profile: No results for input(s): CHOL, HDL, LDLCALC, TRIG, CHOLHDL, LDLDIRECT in the last 72 hours. Thyroid Function Tests: No results for input(s): TSH, T4TOTAL, FREET4, T3FREE, THYROIDAB in the last 72 hours. Anemia Panel: No results for input(s): VITAMINB12, FOLATE, FERRITIN, TIBC, IRON, RETICCTPCT in the last 72 hours. Sepsis Labs: No results for input(s): PROCALCITON, LATICACIDVEN in the last 168 hours.  Recent Results (from the past 240 hour(s))  MRSA PCR Screening     Status: None   Collection Time: 01/31/18  9:52 AM  Result Value Ref Range Status   MRSA by PCR NEGATIVE NEGATIVE Final    Comment:        The GeneXpert MRSA Assay (FDA approved for NASAL specimens only), is one component of a comprehensive MRSA colonization surveillance program. It is not intended to diagnose MRSA infection nor to guide or monitor treatment for MRSA infections. Performed at [facility], [place] [address], [place],  [address]          Radiology Studies: No results found.  Scheduled Meds: . furosemide  40 mg Intravenous Q12H  . guaiFENesin  1,200 mg Oral BID  . ipratropium  0.5 mg Nebulization TID  . levalbuterol  0.63 mg Nebulization TID  . metoprolol succinate  100 mg Oral BID  . nystatin  5 mL Oral QID  . potassium chloride  20 mEq Oral Daily  . Warfarin - Pharmacist Dosing Inpatient   Does not apply q1800   Continuous Infusions:    LOS: 10 days    Time spent: 25 mins.    Shelly Coss, MD [facility] Pager [phone]  If 7PM-7AM, please contact night-coverage [email or web address] Password [email or web address] 02/06/2018, 2:06 PM

## 2018-02-07 LAB — BASIC METABOLIC PANEL
ANION GAP: 6 (ref 5–15)
BUN: 12 mg/dL (ref 6–20)
CALCIUM: 8.2 mg/dL — AB (ref 8.9–10.3)
CO2: 41 mmol/L — AB (ref 22–32)
CREATININE: 0.39 mg/dL — AB (ref 0.44–1.00)
Chloride: 91 mmol/L — ABNORMAL LOW (ref 101–111)
Glucose, Bld: 97 mg/dL (ref 65–99)
Potassium: 4.5 mmol/L (ref 3.5–5.1)
Sodium: 138 mmol/L (ref 135–145)

## 2018-02-07 LAB — MAGNESIUM: Magnesium: 1.8 mg/dL (ref 1.7–2.4)

## 2018-02-07 LAB — PROTIME-INR
INR: 1.1
Prothrombin Time: 14.1 seconds (ref 11.4–15.2)

## 2018-02-07 MED ORDER — WARFARIN SODIUM 2 MG PO TABS
2.0000 mg | ORAL_TABLET | Freq: Once | ORAL | Status: AC
  Administered 2018-02-07: 2 mg via ORAL
  Filled 2018-02-07 (×2): qty 1

## 2018-02-07 MED ORDER — IPRATROPIUM BROMIDE 0.02 % IN SOLN
0.5000 mg | Freq: Two times a day (BID) | RESPIRATORY_TRACT | Status: DC
  Administered 2018-02-07 – 2018-02-08 (×2): 0.5 mg via RESPIRATORY_TRACT
  Filled 2018-02-07 (×2): qty 2.5

## 2018-02-07 MED ORDER — LEVALBUTEROL HCL 0.63 MG/3ML IN NEBU
0.6300 mg | INHALATION_SOLUTION | Freq: Two times a day (BID) | RESPIRATORY_TRACT | Status: DC
  Administered 2018-02-07 – 2018-02-08 (×2): 0.63 mg via RESPIRATORY_TRACT
  Filled 2018-02-07 (×2): qty 3

## 2018-02-07 MED ORDER — MAGNESIUM OXIDE 400 (241.3 MG) MG PO TABS
400.0000 mg | ORAL_TABLET | Freq: Every day | ORAL | Status: DC
  Administered 2018-02-07 – 2018-02-08 (×2): 400 mg via ORAL
  Filled 2018-02-07 (×2): qty 1

## 2018-02-07 MED ORDER — POTASSIUM CHLORIDE CRYS ER 20 MEQ PO TBCR
20.0000 meq | EXTENDED_RELEASE_TABLET | Freq: Two times a day (BID) | ORAL | Status: DC
  Administered 2018-02-07 – 2018-02-08 (×2): 20 meq via ORAL
  Filled 2018-02-07 (×2): qty 1

## 2018-02-07 MED ORDER — POTASSIUM CHLORIDE 10 MEQ/100ML IV SOLN
10.0000 meq | Freq: Once | INTRAVENOUS | Status: AC
  Administered 2018-02-07: 10 meq via INTRAVENOUS
  Filled 2018-02-07: qty 100

## 2018-02-07 NOTE — Progress Notes (Signed)
Nutrition Follow-up  DOCUMENTATION CODES:   Severe malnutrition in context of chronic illness  INTERVENTION:   Continue Magic cup TID with meals, each supplement provides 290 kcal and 9 grams of protein  Encourage PO intake  NUTRITION DIAGNOSIS:   Severe Malnutrition related to poor appetite, chronic illness(CHF) as evidenced by moderate fat depletion, severe fat depletion, moderate muscle depletion, severe muscle depletion. -ongoing  GOAL:   Patient will meet greater than or equal to 90% of their needs  -unmet  MONITOR:   PO intake, Supplement acceptance  ASSESSMENT:   84 year old female who presented to the ED with SOB. PMH significant for atrial fibrillation and lymphoma. Pt admitted for evaluation and treatment of sepsis secondary to pneumonia.  Judith Hunter. Feild continues to eat poorly, PO 10-15% at meals. Not much appetite. DNR, if she decompensates will transition to comfort care per discussion with palliative care and son. Encourage PO intake. Possible d/c to SNF - having insurance issues.    Intake/Output Summary (Last 24 hours) at 02/07/2018 1709 Last data filed at 02/07/2018 1500 Gross per 24 hour  Intake 620 ml  Output 2250 ml  Net -1630 ml  11L fluid negative   Labs reviewed Medications reviewed and include:  20 K BID, Mg Ox  Diet Order:  Diet Heart Room service appropriate? Yes; Fluid consistency: Thin  EDUCATION NEEDS:   No education needs have been identified at this time  Skin:  Skin Assessment: Reviewed RN Assessment(ecchymosis to R and L arm)  Last BM:  01/29/18 large type 7  Height:   Ht Readings from Last 1 Encounters:  01/27/18 5\' 6"  (1.676 m)    Weight:   Wt Readings from Last 1 Encounters:  02/07/18 105 lb (47.6 kg)    Ideal Body Weight:  59.1 kg  BMI:  Body mass index is 16.95 kg/m.  Estimated Nutritional Needs:   Kcal:  1400-1600 kcal/day  Protein:  70-85 grams/day  Fluid:  1.4-1.6 L/day  Judith Hunter. Judith Hunter, Judith Hunter, Judith Hunter  Judith Hunter Inpatient Clinical Dietitian Pager [phone]

## 2018-02-07 NOTE — Progress Notes (Signed)
ANTICOAGULATION CONSULT NOTE - Follow Up Consult  Pharmacy Consult for Coumadin Indication: atrial fibrillation  Allergies  Allergen Reactions  . Azithromycin Other (See Comments)    unknown  . Ciprofloxacin     Felt like her head was on fire    Patient Measurements: Height: 5\' 6"  (167.6 cm) Weight: 105 lb (47.6 kg) IBW/kg (Calculated) : 59.3  Vital Signs: Temp: 97.5 F (36.4 C) (04/15 0518) Temp Source: Oral (04/15 0518) BP: 141/79 (04/15 0518) Pulse Rate: 72 (04/15 0518)  Labs: Recent Labs    02/05/18 0527 02/06/18 0322 02/07/18 0521  LABPROT 44.8* 17.1* 14.1  INR 4.82* 1.40 1.10  CREATININE  --   --  0.39*    Estimated Creatinine Clearance: 34.4 mL/min (A) (by C-G formula based on SCr of 0.39 mg/dL (L)).   Assessment:  AC: Coumadin PTA for afib. INR 4.82 > 1.4 s/p vitamin K 5mg  PO on 4/13, Coumadin resumed. INR 1.1 today. No CBC since 4/8. - Home Regimen:  Coumadin 1.5 mg daily with admit INR 1.85  Goal of Therapy:  INR 2-3 Monitor platelets by anticoagulation protocol: Yes   Plan:  Coumadin 2mg  po x 1 tonight Daily INR Resume home amlodipine for hypertension?   Remon Quinto S. Alford Highland, PharmD, [facility] Clinical Staff Pharmacist Pager [phone]  Eilene Ghazi Stillinger 02/07/2018,10:17 AM

## 2018-02-07 NOTE — Progress Notes (Signed)
Physical Therapy Treatment Patient Details Name: Judith Hunter MRN: [id number] DOB: January 07, 1924 Today's Date: 02/07/2018    History of Present Illness 84 yo female with onset of sepssis PNA and acute hypoxia was admitted and noted quite weak.  Lives alone with some assistance, has PMHx: lymphoma, a-fib, atherosclerosis, cardiomegaly, chronic respiratory failure.    PT Comments    Patient is making gradual progress toward PT goals. Pt limited by back pain in sitting and standing. Pt tolerated short distance gait in room with mod A. Continue to progress as tolerated with anticipated d/c to SNF for further skilled PT services.     Follow Up Recommendations  SNF     Equipment Recommendations  None recommended by PT    Recommendations for Other Services       Precautions / Restrictions Precautions Precautions: Fall    Mobility  Bed Mobility Overal bed mobility: Needs Assistance Bed Mobility: Supine to Sit     Supine to sit: Min assist     General bed mobility comments: assist to elevate trunk into sitting; use of bed rails and HOB elevated  Transfers Overall transfer level: Needs assistance Equipment used: 1 person hand held assist Transfers: Sit to/from Stand Sit to Stand: Mod assist         General transfer comment: assist to power up into standing and to gain balance upon standing; pt tends to brace herself with LE against surface in standing  Ambulation/Gait Ambulation/Gait assistance: Mod assist;+2 safety/equipment Ambulation Distance (Feet): 10 Feet Assistive device: 2 person hand held assist Gait Pattern/deviations: Step-through pattern;Decreased step length - right;Decreased step length - left;Trunk flexed;Narrow base of support Gait velocity: decreased   General Gait Details: assist for balance; multimodal cues for posture and vc for forward gaze   Stairs             Wheelchair Mobility    Modified Rankin (Stroke Patients Only)        Balance Overall balance assessment: Needs assistance Sitting-balance support: Feet supported;Bilateral upper extremity supported Sitting balance-Leahy Scale: Poor     Standing balance support: Bilateral upper extremity supported;During functional activity Standing balance-Leahy Scale: Poor                              Cognition Arousal/Alertness: Awake/alert Behavior During Therapy: Flat affect Overall Cognitive Status: Within Functional Limits for tasks assessed                                        Exercises      General Comments General comments (skin integrity, edema, etc.): multiple bruises       Pertinent Vitals/Pain Pain Assessment: Faces Faces Pain Scale: Hurts even more Pain Location: L humerus with WB, back Pain Descriptors / Indicators: Discomfort;Aching;Sore Pain Intervention(s): Limited activity within patient's tolerance;Monitored during session;Premedicated before session;Repositioned    Home Living                      Prior Function            PT Goals (current goals can now be found in the care plan section) Acute Rehab PT Goals PT Goal Formulation: With patient Time For Goal Achievement: 02/12/18 Potential to Achieve Goals: Good Progress towards PT goals: Progressing toward goals    Frequency    Min 2X/week  PT Plan Current plan remains appropriate    Co-evaluation              AM-PAC PT "6 Clicks" Daily Activity  Outcome Measure  Difficulty turning over in bed (including adjusting bedclothes, sheets and blankets)?: Unable Difficulty moving from lying on back to sitting on the side of the bed? : Unable Difficulty sitting down on and standing up from a chair with arms (e.g., wheelchair, bedside commode, etc,.)?: Unable Help needed moving to and from a bed to chair (including a wheelchair)?: A Lot Help needed walking in hospital room?: A Lot Help needed climbing 3-5 steps with a railing?  : Total 6 Click Score: 8    End of Session Equipment Utilized During Treatment: Gait belt;Oxygen Activity Tolerance: Patient limited by fatigue;Patient limited by pain Patient left: with call bell/phone within reach;in chair;with chair alarm set Nurse Communication: Mobility status PT Visit Diagnosis: Unsteadiness on feet (R26.81);Other abnormalities of gait and mobility (R26.89);Muscle weakness (generalized) (M62.81);Pain Pain - Right/Left: Left Pain - part of body: Arm(back)     Time: 0929-1000 PT Time Calculation (min) (ACUTE ONLY): 31 min  Charges:  $Gait Training: 8-22 mins $Therapeutic Activity: 8-22 mins                    G Codes:       Earney Navy, PTA Pager: [phone]     Darliss Cheney 02/07/2018, 10:50 AM

## 2018-02-07 NOTE — Progress Notes (Signed)
Pt reported some chest pain. Tylenol given. Vitals completed. EKG completed. Pt reported that the pain was gone post Tylenol. NP on file notified. Will continue to monitor.

## 2018-02-07 NOTE — Plan of Care (Signed)
  Problem: Coping: Goal: Level of anxiety will decrease Outcome: Progressing  Patient states feeling of appreciation for assistance from staff.  Patient feels informed 2/2 staff reviewing the next plan of care and transition from hospital.

## 2018-02-07 NOTE — Progress Notes (Signed)
PROGRESS NOTE    Judith Hunter  VHQ:[id number] DOB: [birth year] DOA: 01/27/2018 PCP: Patient, No Pcp Per   Brief Narrative: Patient is a 84 year old female with past medical history of atrial fibrillation, lymphoma who presented to the emergency room with shortness of breath.  Patient was seen at her orthopedics office for back pain and she was found congested and  short of breath so she was sent to the emergency department.  Chest x-ray on the emergency department showed multilevel pneumonia and she was started on antibiotics.  Her respiratory status continued to decline, repeat chest x-ray showed increased pulmonary edema and patient is undergoing diuresis with IV Lasix.  2D echo shows severe pulmonary hypertension, diastolic dysfunction and severe tricuspid regurgitation.  Palliative care consulted on 4/9. Patient has been planned for discharge to skilled nursing facility with palliative care to follow there.  But she is having insurance issues.    Assessment & Plan:   Principal Problem:   Acute respiratory failure with hypoxia (HCC) Active Problems:   Sepsis due to pneumonia ([place])   Protein-calorie malnutrition, severe   Pulmonary hypertension ([place])   Goals of care, counseling/discussion   Palliative care by specialist   Pulmonary edema  Sepsis due to pneumonia: Finished course of  ceftriaxone and doxyxycline .  Cultures NGTD.  Continue bronchodilators.  Respiratory status has slowly improving.  Patient does not look dyspneic or coughing.  Continue supplemental oxygen as needed.  This morning she was on oxygen  at 3 L/min.  Acute hypoxic respiratory failure secondary to pulmonary edema: Could be multifactorial secondary to pneumonia and pulmonary edema.  Continues supplemental oxygen.  Saturating fine on nasal cannula.  Started on IV diuresis with Lasix.  Chest x-ray on 02/01/18 showed significant pulmonary edema and layering bilateral effusions. Patient's respiratory status has improved  . Will change IV Lasix to oral on discharge.  Acute on chronic diastolic CHF with severe pulmonary hypertension: Echo Showed ejection fraction of 55-60%, severely increased PA pressure, left-sided pleural effusion and moderate to severe regurgitation . Continue diuresis.  Hypokalemia/hypomagnesemia: Continue potassium/magnesium supplementation.  A. fib: Rate is controlled.  On anticoagulants with Coumadin.  We will continue to monitor daily INR.patient's CHA2DS2-VASc Score for Stroke Risk is > 2 Continue to monitor INR.Warfarin as per pharmacy.  Oral thrush: Started on nystatin  Deconditioning/multiple comorbidities/advanced age: Palliative care following. Patient is DNR.If she decompensates, her care will be transitioned to comfort as per discussion of palliative care and the son. Current plan is to discharge her to skilled nursing facility.  Social worker on board.  Social worker ,case Freight forwarder discussing with the son.  The plan is to follow-up with palliative care at the skilled nursing facility. Currently we are having problems for discharge to skilled nursing facility due to her insurance, payment issues. Plan is to discuss further with son today.      DVT prophylaxis: Coumadin Code Status: DNR Family Communication: Discussed with son on 02/04/18 Disposition Plan: Skilled nursing facility as soon as bed is available   Consultants: None  Procedures: Echocardiogram  Antimicrobials: None.Completed antibiotics course  Subjective: Patient seen and examined the bedside this morning.  Remains comfortable.  Continues to require oxygen for saturation maintenance.   Objective: Vitals:   02/07/18 0234 02/07/18 0518 02/07/18 0850 02/07/18 1239  BP: (!) 149/95 (!) 141/79  127/68  Pulse: 75 72  77  Resp: 20 (!) 25  (!) 21  Temp: 97.6 F (36.4 C) (!) 97.5 F (36.4 C)  97.6 F (36.4  C)  TempSrc: Oral Oral    SpO2: 97% 97% 96% 95%  Weight:  47.6 kg (105 lb)    Height:         Intake/Output Summary (Last 24 hours) at 02/07/2018 1534 Last data filed at 02/07/2018 0543 Gross per 24 hour  Intake 500 ml  Output 2250 ml  Net -1750 ml   Filed Weights   02/05/18 0515 02/06/18 0519 02/07/18 0518  Weight: 47.6 kg (105 lb) 48.5 kg (107 lb) 47.6 kg (105 lb)    Examination:  General exam: Appears calm and comfortable ,Not in obvious distress, very thin and cachectic HEENT:PERRL,Oral mucosa moist, Ear/Nose normal on gross exam Respiratory system: Bilateral decreased air entry, coarse breathing sounds,basal crackels Cardiovascular system: S1 & S2 heard, RRR. No JVD, murmurs, rubs, gallops or clicks. Gastrointestinal system: Abdomen is nondistended, soft and nontender. No organomegaly or masses felt. Normal bowel sounds heard. Central nervous system: Alert and oriented. No focal neurological deficits. Extremities: No edema, no clubbing ,no cyanosis, distal peripheral pulses palpable. Skin: No rashes,no icterus ,no pallor,skin ulcers   Data Reviewed: I have personally reviewed following labs and imaging studies  CBC: No results for input(s): WBC, NEUTROABS, HGB, HCT, MCV, PLT in the last 168 hours. Basic Metabolic Panel: Recent Labs  Lab 02/01/18 1013 02/02/18 0858 02/03/18 0450 02/04/18 0709 02/06/18 1752 02/07/18 0521  NA 141 144 142 141  --  138  K 3.3* 3.4* 3.3* 3.6 2.8* 4.5  CL 94* 94* 95* 92*  --  91*  CO2 34* 39* 36* 38*  --  41*  GLUCOSE 128* 101* 105* 94  --  97  BUN 11 16 15 14   --  12  CREATININE 0.43* 0.50 0.42* 0.52  --  0.39*  CALCIUM 8.8* 8.4* 8.4* 8.3*  --  8.2*  MG  --   --   --   --  1.4* 1.8   GFR: Estimated Creatinine Clearance: 34.4 mL/min (A) (by C-G formula based on SCr of 0.39 mg/dL (L)). Liver Function Tests: No results for input(s): AST, ALT, ALKPHOS, BILITOT, PROT, ALBUMIN in the last 168 hours. No results for input(s): LIPASE, AMYLASE in the last 168 hours. No results for input(s): AMMONIA in the last 168  hours. Coagulation Profile: Recent Labs  Lab 02/03/18 0450 02/04/18 0709 02/05/18 0527 02/06/18 0322 02/07/18 0521  INR 4.12* 4.15* 4.82* 1.40 1.10   Cardiac Enzymes: No results for input(s): CKTOTAL, CKMB, CKMBINDEX, TROPONINI in the last 168 hours. BNP (last 3 results) No results for input(s): PROBNP in the last 8760 hours. HbA1C: No results for input(s): HGBA1C in the last 72 hours. CBG: No results for input(s): GLUCAP in the last 168 hours. Lipid Profile: No results for input(s): CHOL, HDL, LDLCALC, TRIG, CHOLHDL, LDLDIRECT in the last 72 hours. Thyroid Function Tests: No results for input(s): TSH, T4TOTAL, FREET4, T3FREE, THYROIDAB in the last 72 hours. Anemia Panel: No results for input(s): VITAMINB12, FOLATE, FERRITIN, TIBC, IRON, RETICCTPCT in the last 72 hours. Sepsis Labs: No results for input(s): PROCALCITON, LATICACIDVEN in the last 168 hours.  Recent Results (from the past 240 hour(s))  MRSA PCR Screening     Status: None   Collection Time: 01/31/18  9:52 AM  Result Value Ref Range Status   MRSA by PCR NEGATIVE NEGATIVE Final    Comment:        The GeneXpert MRSA Assay (FDA approved for NASAL specimens only), is one component of a comprehensive MRSA colonization surveillance program. It is not  intended to diagnose MRSA infection nor to guide or monitor treatment for MRSA infections. Performed at [facility], [place] [address] [place],  [address]          Radiology Studies: No results found.      Scheduled Meds: . furosemide  40 mg Intravenous Q12H  . guaiFENesin  1,200 mg Oral BID  . ipratropium  0.5 mg Nebulization BID  . levalbuterol  0.63 mg Nebulization BID  . magnesium oxide  400 mg Oral Daily  . metoprolol succinate  100 mg Oral BID  . nystatin  5 mL Oral QID  . potassium chloride  20 mEq Oral BID  . warfarin  2 mg Oral ONCE-1800  . Warfarin - Pharmacist Dosing Inpatient   Does not apply q1800   Continuous  Infusions:    LOS: 11 days    Time spent: 25 mins.    Shelly Coss, MD [facility] Pager [phone]  If 7PM-7AM, please contact night-coverage [email or web address] Password [facility] 02/07/2018, 3:34 PM

## 2018-02-08 LAB — BASIC METABOLIC PANEL
ANION GAP: 10 (ref 5–15)
BUN: 11 mg/dL (ref 6–20)
CALCIUM: 8.3 mg/dL — AB (ref 8.9–10.3)
CO2: 40 mmol/L — ABNORMAL HIGH (ref 22–32)
Chloride: 88 mmol/L — ABNORMAL LOW (ref 101–111)
Creatinine, Ser: 0.47 mg/dL (ref 0.44–1.00)
GLUCOSE: 104 mg/dL — AB (ref 65–99)
Potassium: 3.5 mmol/L (ref 3.5–5.1)
Sodium: 138 mmol/L (ref 135–145)

## 2018-02-08 LAB — PROTIME-INR
INR: 1.16
PROTHROMBIN TIME: 14.7 s (ref 11.4–15.2)

## 2018-02-08 MED ORDER — POTASSIUM CHLORIDE CRYS ER 20 MEQ PO TBCR: 20.0000 meq | EXTENDED_RELEASE_TABLET | Freq: Two times a day (BID) | ORAL | 0 refills | Status: AC

## 2018-02-08 MED ORDER — IPRATROPIUM BROMIDE 0.02 % IN SOLN: 0.5000 mg | Freq: Two times a day (BID) | RESPIRATORY_TRACT | 0 refills | Status: AC

## 2018-02-08 MED ORDER — LEVALBUTEROL HCL 0.63 MG/3ML IN NEBU: 0.6300 mg | INHALATION_SOLUTION | Freq: Two times a day (BID) | RESPIRATORY_TRACT | 0 refills | Status: AC

## 2018-02-08 MED ORDER — HYDROCODONE-ACETAMINOPHEN 5-325 MG PO TABS: 1.0000 | ORAL_TABLET | Freq: Four times a day (QID) | ORAL | 0 refills | Status: AC | PRN

## 2018-02-08 MED ORDER — WARFARIN SODIUM 3 MG PO TABS
3.0000 mg | ORAL_TABLET | Freq: Once | ORAL | Status: DC
  Filled 2018-02-08: qty 1

## 2018-02-08 MED ORDER — METOPROLOL SUCCINATE ER 100 MG PO TB24: 100.0000 mg | ORAL_TABLET | Freq: Two times a day (BID) | ORAL | 0 refills | Status: AC

## 2018-02-08 MED ORDER — MAGNESIUM OXIDE 400 (241.3 MG) MG PO TABS: 400.0000 mg | ORAL_TABLET | Freq: Every day | ORAL | 0 refills | Status: AC

## 2018-02-08 MED ORDER — GUAIFENESIN ER 600 MG PO TB12: 1200.0000 mg | ORAL_TABLET | Freq: Two times a day (BID) | ORAL | 0 refills | Status: AC

## 2018-02-08 MED ORDER — FUROSEMIDE 40 MG PO TABS: 40.0000 mg | ORAL_TABLET | Freq: Two times a day (BID) | ORAL | 0 refills | Status: AC

## 2018-02-08 NOTE — Progress Notes (Signed)
Patient report called in to [facility].  Report given to [facility].  Per Mrs Valere Dross if patient arrives after 8:30 pharmacy will be closed and patient would not be admitted to their facility.  Contacted SW to relate needed arrival time to the facility.  Per [facility] and inform them of needed patient arrival time.  Per [facility] she is number two on their list and they would be able to have patient to [facility] facility before that time.

## 2018-02-08 NOTE — Discharge Summary (Signed)
Physician Discharge Summary  Judith Hunter MHD:[id number] DOB: December 29, 1923 DOA: 01/27/2018  PCP: Patient, No Pcp Per  Admit date: 01/27/2018 Discharge date: 02/08/2018  Admitted From: Home Disposition:  SNF  Discharge Condition: Stable CODE STATUS:DNR Diet recommendation: Heart Healthy  Brief/Interim Summary: Patient is a 84 year old female with past medical history of atrial fibrillation, lymphoma who presented to the emergency room with shortness of breath.  Patient was seen at her orthopedics office for back pain and she was found congested and  short of breath so she was sent to the emergency department.  Chest x-ray on the emergency department showed multilevel pneumonia and she was started on antibiotics.  Her respiratory status continued to decline, repeat chest x-ray showed increased pulmonary edema and patient is underwent  diuresis with IV Lasix.  2D echo shows severe pulmonary hypertension, diastolic dysfunction and severe tricuspid regurgitation. Patient's respiratory status gradually improved with IV diuresis.  She completed the antibiotics course for pneumonia. Palliative care consulted on 4/9   for her end stage CHF. Patient will be  discharge to skilled nursing facility today. Palliative care will follow her  there.    Following problems were addressed during her hospitalization:  Sepsis due to pneumonia: Finished course of  ceftriaxone and doxyxycline .  Cultures NGTD.  Continue bronchodilators.  Respiratory status has slowly improving.  Patient does not look dyspneic or coughing.  Continue supplemental oxygen as needed. This morning she was on oxygen  at 3 L/min.  Acute hypoxic respiratory failure secondary to pulmonary edema: Could be multifactorial secondary to pneumonia and pulmonary edema.  Continues supplemental oxygen.  Saturating fine on nasal cannula.  Started on IV diuresis with Lasix.  Chest x-ray on 02/01/18 showed significant pulmonary edema and layering bilateral  effusions. Patient's respiratory status has improved . Will change IV Lasix to oral on discharge.  Acute on chronic diastolic CHF with severe pulmonary hypertension: Echo Showed ejection fraction of 55-60%, severely increased PA pressure, left-sided pleural effusion and moderate to severe regurgitation . Continue diuresis. Follow up with cardiology as an outpatient  Hypokalemia/hypomagnesemia: Continue potassium/magnesium supplementation.  A. fib: Rate is controlled.  On anticoagulants with Coumadin.  We will continue to monitor daily INR.patient's CHA2DS2-VASc Score for Stroke Risk is > 2 Continue to monitor INR and titrate warfarin dose  Oral thrush: Treated with nystatin  Deconditioning/multiple comorbidities/advanced age: Palliative care was following. Patient is DNR.Current plan is to discharge her to skilled nursing facility and palliative care will follow her there.  Discharge Diagnoses:  Principal Problem:   Acute respiratory failure with hypoxia ([place]) Active Problems:   Sepsis due to pneumonia ([place])   Protein-calorie malnutrition, severe   Pulmonary hypertension ([place])   Goals of care, counseling/discussion   Palliative care by specialist   Pulmonary edema    Discharge Instructions  Discharge Instructions    Diet - low sodium heart healthy   Complete by:  As directed    Discharge instructions   Complete by:  As directed    1) Take prescribed medications as instructed. 2) Check BMP and magnesium level in a week. 3) Restrict fluid intake to less than 1200 ml a day. 4) Follow up with cardiology as an outpatient.  5) Follow up with Palliative care in the skilled nursing facility. 6) Monitor INR .   Increase activity slowly   Complete by:  As directed      Allergies as of 02/08/2018      Reactions   Azithromycin Other (See Comments)   unknown  Ciprofloxacin    Felt like her head was on fire      Medication List    STOP taking these medications    potassium chloride 10 MEQ CR capsule Commonly known as:  MICRO-K     TAKE these medications   acetaminophen 500 MG tablet Commonly known as:  TYLENOL Take 500 mg by mouth as needed for mild pain.   amLODipine 5 MG tablet Commonly known as:  NORVASC Take 5 mg by mouth daily.   COUMADIN 2 MG tablet Generic drug:  warfarin Take Coumadin 0.5 mg today on 12/6, recheck PT/INR on 12/7.  Patient may resume her maintenance dose of 2 mg daily on 12/7 if INR is between 2 and 3 What changed:    how much to take  when to take this  additional instructions   furosemide 40 MG tablet Commonly known as:  LASIX Take 1 tablet (40 mg total) by mouth 2 (two) times daily. What changed:    medication strength  how much to take  when to take this   guaiFENesin 600 MG 12 hr tablet Commonly known as:  MUCINEX Take 2 tablets (1,200 mg total) by mouth 2 (two) times daily.   HYDROcodone-acetaminophen 5-325 MG tablet Commonly known as:  NORCO/VICODIN Take 1 tablet by mouth every 6 (six) hours as needed for moderate pain or severe pain.   ipratropium 0.02 % nebulizer solution Commonly known as:  ATROVENT Take 2.5 mLs (0.5 mg total) by nebulization 2 (two) times daily.   levalbuterol 0.63 MG/3ML nebulizer solution Commonly known as:  XOPENEX Take 3 mLs (0.63 mg total) by nebulization 2 (two) times daily.   magnesium oxide 400 (241.3 Mg) MG tablet Commonly known as:  MAG-OX Take 1 tablet (400 mg total) by mouth daily. Start taking on:  02/09/2018   metoprolol succinate 100 MG 24 hr tablet Commonly known as:  TOPROL XL Take 1 tablet (100 mg total) by mouth 2 (two) times daily. Take with or immediately following a meal. What changed:    how much to take  additional instructions   polyethylene glycol packet Commonly known as:  MIRALAX / GLYCOLAX Take 17 g by mouth daily as needed for severe constipation.   potassium chloride SA 20 MEQ tablet Commonly known as:  K-DUR,KLOR-CON Take  1 tablet (20 mEq total) by mouth 2 (two) times daily.   senna-docusate 8.6-50 MG tablet Commonly known as:  Senokot-S Take 1 tablet by mouth at bedtime.       Allergies  Allergen Reactions  . Azithromycin Other (See Comments)    unknown  . Ciprofloxacin     Felt like her head was on fire    Consultations: Palliative care  Procedures/Studies: Dg Chest 2 View  Result Date: 01/29/2018 CLINICAL DATA:  Hypoxia.  Atrial fibrillation. EXAM: CHEST - 2 VIEW COMPARISON:  01/27/2018 FINDINGS: Radiographic worsening with enlarging effusions an worsened lower lobe atelectasis and or pneumonia. Diffuse edema pattern persists and is worsened. IMPRESSION: Worsening of congestive heart failure pattern with enlarging effusions and worsened lower lobe volume loss and/or infiltrate. Electronically Signed   By: Nelson Chimes M.D.   On: 01/29/2018 15:58   Dg Abd 1 View  Result Date: 01/27/2018 CLINICAL DATA:  Increased shortness of breath in the past day, speaking in short phrases, history atrial fibrillation, remote history of lymphoma in remission EXAM: ABDOMEN - 1 VIEW COMPARISON:  None FINDINGS: Scattered air-filled loops of bowel in the mid abdomen and pelvis. No definite bowel wall  thickening or bowel dilatation. Bones diffusely demineralized. Number of small calcifications are seen in the RIGHT abdomen extending over the iliac crest, uncertain if representing superimposed artifact or a very low-lying gallbladder containing calculi. Scattered atherosclerotic calcifications. IMPRESSION: Nonspecific bowel gas pattern. Cannot exclude cholelithiasis though this may represent superimposed artifacts; consider follow-up some RIGHT upper quadrant sonography. Electronically Signed   By: Lavonia Dana M.D.   On: 01/27/2018 17:12   Dg Chest Port 1 View  Result Date: 02/01/2018 CLINICAL DATA:  Sudden onset shortness of breath, respiratory distress EXAM: PORTABLE CHEST 1 VIEW COMPARISON:  01/29/2018 FINDINGS:  Cardiomegaly. Diffuse bilateral airspace disease again noted, worsening since prior study. Moderate layering effusions. 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 IMPRESSION: Worsening bilateral airspace disease, likely worsening CHF. Moderate layering bilateral effusions. Electronically Signed   By: Rolm Baptise M.D.   On: 02/01/2018 10:43   Dg Chest Portable 1 View  Result Date: 01/27/2018 CLINICAL DATA:  Worsening shortness of breath. History of atrial fibrillation. EXAM: PORTABLE CHEST 1 VIEW COMPARISON:  PA and lateral chest 09/25/2017. FINDINGS: There is marked cardiomegaly. Distortion of the pulmonary architecture is seen and there is coarsening of the pulmonary interstitium most conspicuous in the left mid and lower lung zones. Airspace opacity is seen in the right upper lobe and there is coarsening of the interstitium in the left mid and lower lung zones. Small bilateral pleural effusions are seen. Aortic atherosclerosis is noted. No pneumothorax. 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 IMPRESSION: Right upper lobe airspace opacity worrisome for pneumonia. Interstitial opacities in the left mid and lower lung zones could also be due to pneumonia. Marked cardiomegaly without edema. Small bilateral pleural effusions. Atherosclerosis. Electronically Signed   By: Inge Rise M.D.   On: 01/27/2018 11:12   Xr Lumbar Spine 2-3 Views  Result Date: 01/31/2018 AP lateral lumbar spine reviewed.  Osteo-pia present.  Compression fractures likely age indeterminant.  Dilated bowel loops present.   (Echo, Carotid, EGD, Colonoscopy, ERCP)    Subjective: Patient seen and examined the bedside this morning.  Remains comfortable.  Continues to require supplemental oxygen.  Stable for discharge to nursing home today.  Discharge Exam: Vitals:   02/08/18 0906 02/08/18 1008  BP:  (!) 149/93  Pulse:  76  Resp:    Temp:    SpO2: 96%    Vitals:    02/07/18 2109 02/08/18 0528 02/08/18 0906 02/08/18 1008  BP: 140/68 139/83  (!) 149/93  Pulse: 74 75  76  Resp: 20 20    Temp: 97.8 F (36.6 C) (!) 97.3 F (36.3 C)    TempSrc: Oral Axillary    SpO2: 94% 98% 96%   Weight:  50.3 kg (111 lb)    Height:        General: Pt is alert, awake, not in acute distress Cardiovascular: RRR, S1/S2 +, no rubs, no gallops Respiratory: Bilateral decreased air entry, basal crackles Abdominal: Soft, NT, ND, bowel sounds + Extremities: no edema, no cyanosis    The results of significant diagnostics from this hospitalization (including imaging, microbiology, ancillary and laboratory) are listed below for reference.     Microbiology: Recent Results (from the past 240 hour(s))  MRSA PCR Screening     Status: None   Collection Time: 01/31/18  9:52 AM  Result Value Ref Range Status   MRSA by PCR NEGATIVE NEGATIVE Final    Comment:        The GeneXpert MRSA  Assay (FDA approved for NASAL specimens only), is one component of a comprehensive MRSA colonization surveillance program. It is not intended to diagnose MRSA infection nor to guide or monitor treatment for MRSA infections. Performed at [facility], [place] [address], [place], [place] [address]      Labs: BNP (last 3 results) Recent Labs    09/25/17 0052 01/27/18 1250 01/27/18 1529  BNP 132.0* 220.4* 366.4*   Basic Metabolic Panel: Recent Labs  Lab 02/02/18 0858 02/03/18 0450 02/04/18 0709 02/06/18 1752 02/07/18 0521 02/08/18 0304  NA 144 142 141  --  138 138  K 3.4* 3.3* 3.6 2.8* 4.5 3.5  CL 94* 95* 92*  --  91* 88*  CO2 39* 36* 38*  --  41* 40*  GLUCOSE 101* 105* 94  --  97 104*  BUN 16 15 14   --  12 11  CREATININE 0.50 0.42* 0.52  --  0.39* 0.47  CALCIUM 8.4* 8.4* 8.3*  --  8.2* 8.3*  MG  --   --   --  1.4* 1.8  --    Liver Function Tests: No results for input(s): AST, ALT, ALKPHOS, BILITOT, PROT, ALBUMIN in the last 168 hours. No results for input(s):  LIPASE, AMYLASE in the last 168 hours. No results for input(s): AMMONIA in the last 168 hours. CBC: No results for input(s): WBC, NEUTROABS, HGB, HCT, MCV, PLT in the last 168 hours. Cardiac Enzymes: No results for input(s): CKTOTAL, CKMB, CKMBINDEX, TROPONINI in the last 168 hours. BNP: Invalid input(s): POCBNP CBG: No results for input(s): GLUCAP in the last 168 hours. D-Dimer No results for input(s): DDIMER in the last 72 hours. Hgb A1c No results for input(s): HGBA1C in the last 72 hours. Lipid Profile No results for input(s): CHOL, HDL, LDLCALC, TRIG, CHOLHDL, LDLDIRECT in the last 72 hours. Thyroid function studies No results for input(s): TSH, T4TOTAL, T3FREE, THYROIDAB in the last 72 hours.  Invalid input(s): FREET3 Anemia work up No results for input(s): VITAMINB12, FOLATE, FERRITIN, TIBC, IRON, RETICCTPCT in the last 72 hours. Urinalysis    Component Value Date/Time   COLORURINE AMBER (A) 09/25/2017 0215   APPEARANCEUR HAZY (A) 09/25/2017 0215   LABSPEC 1.024 09/25/2017 0215   PHURINE 5.0 09/25/2017 0215   GLUCOSEU NEGATIVE 09/25/2017 0215   HGBUR NEGATIVE 09/25/2017 0215   BILIRUBINUR NEGATIVE 09/25/2017 0215   KETONESUR NEGATIVE 09/25/2017 0215   PROTEINUR NEGATIVE 09/25/2017 0215   NITRITE NEGATIVE 09/25/2017 0215   LEUKOCYTESUR MODERATE (A) 09/25/2017 0215   Sepsis Labs Invalid input(s): PROCALCITONIN,  WBC,  LACTICIDVEN Microbiology Recent Results (from the past 240 hour(s))  MRSA PCR Screening     Status: None   Collection Time: 01/31/18  9:52 AM  Result Value Ref Range Status   MRSA by PCR NEGATIVE NEGATIVE Final    Comment:        The GeneXpert MRSA Assay (FDA approved for NASAL specimens only), is one component of a comprehensive MRSA colonization surveillance program. It is not intended to diagnose MRSA infection nor to guide or monitor treatment for MRSA infections. Performed at [facility], [place] [address], [place], [place]  [address]      Time coordinating discharge: 35 minutes  SIGNED:   Shelly Coss, MD  [facility] 02/08/2018, 1:45 PM Pager [phone]  If 7PM-7AM, please contact night-coverage [email or web address] Password [email or web address]

## 2018-02-08 NOTE — Social Work (Addendum)
CSW has received a call from pt son, he states that pt has finances in her account to cover a SNF stay and pay off her bill at [facility]. Pt son states that he has f/u with Caryl Pina in admissions there.   CSW has left message for Marita Kansas in admissions for f/u call regarding discharge.   3:08pm- Clinical Social Worker facilitated patient discharge including contacting patient family and facility to confirm patient discharge plans.  Clinical information faxed to facility and family agreeable with plan.  CSW arranged ambulance transport via [facility] to [facility].   RN to call [phone] with report  prior to discharge.  Clinical Social Worker will sign off for now as social work intervention is no longer needed. Please consult [place] again if new need arises.  Alexander Mt, [place] Social Worker [phone]

## 2018-02-08 NOTE — Clinical Social Work Placement (Signed)
   CLINICAL SOCIAL WORK PLACEMENT  NOTE Roman Sadie Haber RN to call [phone] with report  prior to discharge.   Date:  02/08/2018  Patient Details  Name: Judith Hunter MRN: [id number] Date of Birth: Apr 26, 1924  Clinical Social Work is seeking post-discharge placement for this patient at the [place] level of care (*CSW will initial, date and re-position this form in  chart as items are completed):  Yes   Patient/family provided with [facility] Work Department's list of facilities offering this level of care within the geographic area requested by the patient (or if unable, by the patient's family).  Yes   Patient/family informed of their freedom to choose among providers that offer the needed level of care, that participate in Medicare, Medicaid or managed care program needed by the patient, have an available bed and are willing to accept the patient.  Yes   Patient/family informed of [facility]'s ownership interest in [facility] and [facility], as well as of the fact that they are under no obligation to receive care at these facilities.  PASRR submitted to EDS on       PASRR number received on       Existing PASRR number confirmed on       FL2 transmitted to all facilities in geographic area requested by pt/family on 02/03/18     FL2 transmitted to all facilities within larger geographic area on       Patient informed that his/her managed care company has contracts with or will negotiate with certain facilities, including the following:        Yes   Patient/family informed of bed offers received.  Patient chooses bed at [facility]     Physician recommends and patient chooses bed at      Patient to be transferred to [facility] on 02/08/18.  Patient to be transferred to facility by [facility]     Patient family notified on 02/08/18 of transfer.  Name of family member notified:   son Judith Hunter     PHYSICIAN       Additional Comment:    _______________________________________________ Alexander Mt, [place] 02/08/2018, 4:38 PM

## 2018-02-08 NOTE — Progress Notes (Signed)
ANTICOAGULATION CONSULT NOTE - Follow Up Consult  Pharmacy Consult for Coumadin Indication: atrial fibrillation  Allergies  Allergen Reactions  . Azithromycin Other (See Comments)    unknown  . Ciprofloxacin     Felt like her head was on fire    Patient Measurements: Height: 5\' 6"  (167.6 cm) Weight: 111 lb (50.3 kg) IBW/kg (Calculated) : 59.3  Vital Signs: Temp: 97.3 F (36.3 C) (04/16 0528) Temp Source: Axillary (04/16 0528) BP: 139/83 (04/16 0528) Pulse Rate: 75 (04/16 0528)  Labs: Recent Labs    02/06/18 0322 02/07/18 0521 02/08/18 0304  LABPROT 17.1* 14.1 14.7  INR 1.40 1.10 1.16  CREATININE  --  0.39* 0.47    Estimated Creatinine Clearance: 36.4 mL/min (by C-G formula based on SCr of 0.47 mg/dL).   Assessment:  AC: Coumadin PTA for afib. INR peaked 4.82 (on Rocephin) s/p vitamin K 5mg  PO on 4/13, Coumadin resumed. INR 1.1 again today. Will take a few days to overcome the Vitamin K in her system. No CBC since 4/8. - Home Regimen:  Coumadin 1.5 mg daily with admit INR 1.85  Goal of Therapy:  INR 2-3 Monitor platelets by anticoagulation protocol: Yes   Plan:  Coumadin 3mg  po x 1 tonight Daily INR Resume home amlodipine for hypertension?   Burgess Sheriff S. Alford Highland, PharmD, BCPS Clinical Staff Pharmacist Pager [phone]  Eilene Ghazi Stillinger 02/08/2018,8:19 AM

## 2018-02-08 NOTE — Progress Notes (Signed)
Patient discharged via ambulance to [facility] facility.  Iv access removed and compression dressing applied.  Discharge instructions along with prescriptions sent with ambulance driver.

## 2018-02-08 NOTE — Progress Notes (Signed)
Palliative:  No charge note.  Met w/ patient to complete MOST form before discharge. Patient unable to complete form and son not in town today. Verified that DNR on patient's chart.  Thank you for this consult.  Juel Burrow, DNP, AGNP-C Palliative Medicine Team Team Phone # [phone]

## 2018-05-14 ENCOUNTER — Inpatient Hospital Stay
Admission: RE | Admit: 2018-05-14 | Discharge: 2018-06-03 | Disposition: A | Discharge status: Skilled Nursing Facility | Payer: Self-pay | Source: Other Acute Inpatient Hospital | Attending: Internal Medicine | Admitting: Internal Medicine

## 2018-05-14 DIAGNOSIS — R140 Abdominal distension (gaseous): Secondary | ICD-10-CM

## 2018-05-14 DIAGNOSIS — R0602 Shortness of breath: Secondary | ICD-10-CM

## 2018-05-14 DIAGNOSIS — J9690 Respiratory failure, unspecified, unspecified whether with hypoxia or hypercapnia: Secondary | ICD-10-CM

## 2018-05-14 LAB — PROTIME-INR
INR: 2.88
Prothrombin Time: 29.9 seconds — ABNORMAL HIGH (ref 11.4–15.2)

## 2018-05-15 ENCOUNTER — Other Ambulatory Visit (HOSPITAL_COMMUNITY): Payer: Self-pay

## 2018-05-15 LAB — PROTIME-INR
INR: 3.02
Prothrombin Time: 31.1 s — ABNORMAL HIGH (ref 11.4–15.2)

## 2018-05-15 LAB — COMPREHENSIVE METABOLIC PANEL
ALK PHOS: 137 U/L — AB (ref 38–126)
ALT: 28 U/L (ref 0–44)
AST: 32 U/L (ref 15–41)
Albumin: 2.7 g/dL — ABNORMAL LOW (ref 3.5–5.0)
Anion gap: 11 (ref 5–15)
BUN: 22 mg/dL (ref 8–23)
CHLORIDE: 89 mmol/L — AB (ref 98–111)
CO2: 39 mmol/L — AB (ref 22–32)
Calcium: 8.6 mg/dL — ABNORMAL LOW (ref 8.9–10.3)
Creatinine, Ser: 0.39 mg/dL — ABNORMAL LOW (ref 0.44–1.00)
GFR calc Af Amer: >60 mL/min (ref >60)
GFR calc non Af Amer: >60 mL/min (ref >60)
Glucose, Bld: 109 mg/dL — ABNORMAL HIGH (ref 70–99)
Potassium: 3.8 mmol/L (ref 3.5–5.1)
SODIUM: 139 mmol/L (ref 135–145)
Total Bilirubin: 1.2 mg/dL (ref 0.3–1.2)
Total Protein: 4.8 g/dL — ABNORMAL LOW (ref 6.5–8.1)

## 2018-05-15 LAB — CBC WITH DIFFERENTIAL/PLATELET
ABS IMMATURE GRANULOCYTES: 0.1 10*3/uL (ref 0.0–0.1)
Basophils Absolute: 0 10*3/uL (ref 0.0–0.1)
Basophils Relative: 0 %
Eosinophils Absolute: 0 10*3/uL (ref 0.0–0.7)
Eosinophils Relative: 0 %
HEMATOCRIT: 29.6 % — AB (ref 36.0–46.0)
Hemoglobin: 9.2 g/dL — ABNORMAL LOW (ref 12.0–15.0)
IMMATURE GRANULOCYTES: 1 %
LYMPHS ABS: 0.7 10*3/uL (ref 0.7–4.0)
Lymphocytes Relative: 7 %
MCH: 31.9 pg (ref 26.0–34.0)
MCHC: 31.1 g/dL (ref 30.0–36.0)
MCV: 102.8 fL — AB (ref 78.0–100.0)
MONOS PCT: 3 %
Monocytes Absolute: 0.3 10*3/uL (ref 0.1–1.0)
NEUTROS ABS: 7.9 10*3/uL — AB (ref 1.7–7.7)
NEUTROS PCT: 89 %
PLATELETS: 186 10*3/uL (ref 150–400)
RBC: 2.88 MIL/uL — ABNORMAL LOW (ref 3.87–5.11)
RDW: 14.9 % (ref 11.5–15.5)
WBC: 9 10*3/uL (ref 4.0–10.5)

## 2018-05-15 LAB — PHOSPHORUS: Phosphorus: 3.1 mg/dL (ref 2.5–4.6)

## 2018-05-15 LAB — MAGNESIUM: Magnesium: 2 mg/dL (ref 1.7–2.4)

## 2018-05-16 LAB — PROTIME-INR
INR: 2.59
PROTHROMBIN TIME: 27.5 s — AB (ref 11.4–15.2)

## 2018-05-17 ENCOUNTER — Other Ambulatory Visit (HOSPITAL_COMMUNITY): Payer: Self-pay

## 2018-05-17 LAB — BLOOD GAS, ARTERIAL
Acid-Base Excess: 19.6 mmol/L — ABNORMAL HIGH (ref 0.0–2.0)
BICARBONATE: 45.7 mmol/L — AB (ref 20.0–28.0)
Delivery systems: POSITIVE
Expiratory PAP: 7
FIO2: 40
Inspiratory PAP: 16
Mode: POSITIVE
O2 Saturation: 98.7 %
PATIENT TEMPERATURE: 98.6
PO2 ART: 124 mmHg — AB (ref 83.0–108.0)
pCO2 arterial: 75.1 mmHg (ref 32.0–48.0)
pH, Arterial: 7.401 (ref 7.350–7.450)

## 2018-05-17 LAB — BASIC METABOLIC PANEL
Anion gap: 6 (ref 5–15)
BUN: 26 mg/dL — ABNORMAL HIGH (ref 8–23)
CHLORIDE: 92 mmol/L — AB (ref 98–111)
CO2: 45 mmol/L — ABNORMAL HIGH (ref 22–32)
CREATININE: 0.46 mg/dL (ref 0.44–1.00)
Calcium: 8.8 mg/dL — ABNORMAL LOW (ref 8.9–10.3)
GFR calc Af Amer: >60 mL/min (ref >60)
Glucose, Bld: 110 mg/dL — ABNORMAL HIGH (ref 70–99)
Potassium: 4.6 mmol/L (ref 3.5–5.1)
SODIUM: 143 mmol/L (ref 135–145)

## 2018-05-17 LAB — CBC
HCT: 31.4 % — ABNORMAL LOW (ref 36.0–46.0)
HEMOGLOBIN: 9.6 g/dL — AB (ref 12.0–15.0)
MCH: 33.1 pg (ref 26.0–34.0)
MCHC: 30.6 g/dL (ref 30.0–36.0)
MCV: 108.3 fL — ABNORMAL HIGH (ref 78.0–100.0)
PLATELETS: 141 10*3/uL — AB (ref 150–400)
RBC: 2.9 MIL/uL — ABNORMAL LOW (ref 3.87–5.11)
RDW: 15.3 % (ref 11.5–15.5)
WBC: 7.3 10*3/uL (ref 4.0–10.5)

## 2018-05-17 LAB — PROTIME-INR
INR: 2.47
Prothrombin Time: 26.5 seconds — ABNORMAL HIGH (ref 11.4–15.2)

## 2018-05-18 LAB — PROTIME-INR
INR: 2.35
Prothrombin Time: 25.5 seconds — ABNORMAL HIGH (ref 11.4–15.2)

## 2018-05-20 LAB — PROTIME-INR
INR: 1.69
Prothrombin Time: 19.7 seconds — ABNORMAL HIGH (ref 11.4–15.2)

## 2018-05-21 LAB — BASIC METABOLIC PANEL
ANION GAP: 8 (ref 5–15)
BUN: 31 mg/dL — ABNORMAL HIGH (ref 8–23)
CO2: 46 mmol/L — AB (ref 22–32)
Calcium: 9.1 mg/dL (ref 8.9–10.3)
Chloride: 90 mmol/L — ABNORMAL LOW (ref 98–111)
Creatinine, Ser: 0.54 mg/dL (ref 0.44–1.00)
GFR calc non Af Amer: >60 mL/min (ref >60)
Glucose, Bld: 100 mg/dL — ABNORMAL HIGH (ref 70–99)
Potassium: 5.1 mmol/L (ref 3.5–5.1)
SODIUM: 144 mmol/L (ref 135–145)

## 2018-05-21 LAB — URINALYSIS, ROUTINE W REFLEX MICROSCOPIC
BACTERIA UA: NONE SEEN
BILIRUBIN URINE: NEGATIVE
Glucose, UA: 50 mg/dL — AB
KETONES UR: NEGATIVE mg/dL
Nitrite: NEGATIVE
PH: 5 (ref 5.0–8.0)
Protein, ur: 100 mg/dL — AB
SPECIFIC GRAVITY, URINE: 1.016 (ref 1.005–1.030)

## 2018-05-21 LAB — CBC
HEMATOCRIT: 34.5 % — AB (ref 36.0–46.0)
HEMOGLOBIN: 10.6 g/dL — AB (ref 12.0–15.0)
MCH: 32 pg (ref 26.0–34.0)
MCHC: 30.7 g/dL (ref 30.0–36.0)
MCV: 104.2 fL — ABNORMAL HIGH (ref 78.0–100.0)
Platelets: 84 10*3/uL — ABNORMAL LOW (ref 150–400)
RBC: 3.31 MIL/uL — AB (ref 3.87–5.11)
RDW: 16.2 % — ABNORMAL HIGH (ref 11.5–15.5)
WBC: 4.7 10*3/uL (ref 4.0–10.5)

## 2018-05-21 LAB — PROTIME-INR
INR: 1.53
Prothrombin Time: 18.2 seconds — ABNORMAL HIGH (ref 11.4–15.2)

## 2018-05-22 ENCOUNTER — Other Ambulatory Visit (HOSPITAL_COMMUNITY): Payer: Self-pay

## 2018-05-22 LAB — PROTIME-INR
INR: 1.69
Prothrombin Time: 19.7 seconds — ABNORMAL HIGH (ref 11.4–15.2)

## 2018-05-23 ENCOUNTER — Other Ambulatory Visit (HOSPITAL_COMMUNITY): Payer: Self-pay

## 2018-05-23 LAB — URINE CULTURE: Culture: >=100000 — AB

## 2018-05-23 LAB — PROTIME-INR
INR: 2.02
Prothrombin Time: 22.7 seconds — ABNORMAL HIGH (ref 11.4–15.2)

## 2018-05-24 LAB — PROTIME-INR
INR: 2.58
Prothrombin Time: 27.4 seconds — ABNORMAL HIGH (ref 11.4–15.2)

## 2018-05-25 LAB — PROTIME-INR
INR: 2.69
PROTHROMBIN TIME: 28.4 s — AB (ref 11.4–15.2)

## 2018-05-26 LAB — PROTIME-INR
INR: 3.39
PROTHROMBIN TIME: 34 s — AB (ref 11.4–15.2)

## 2018-05-27 LAB — PROTIME-INR
INR: 2.67
Prothrombin Time: 28.2 seconds — ABNORMAL HIGH (ref 11.4–15.2)

## 2018-05-28 LAB — PROTIME-INR
INR: 2.25
PROTHROMBIN TIME: 24.6 s — AB (ref 11.4–15.2)

## 2018-05-29 LAB — COMPREHENSIVE METABOLIC PANEL
ALT: 51 U/L — ABNORMAL HIGH (ref 0–44)
AST: 47 U/L — AB (ref 15–41)
Albumin: 2.5 g/dL — ABNORMAL LOW (ref 3.5–5.0)
Alkaline Phosphatase: 218 U/L — ABNORMAL HIGH (ref 38–126)
Anion gap: 11 (ref 5–15)
BUN: 38 mg/dL — ABNORMAL HIGH (ref 8–23)
CHLORIDE: 90 mmol/L — AB (ref 98–111)
CO2: 41 mmol/L — ABNORMAL HIGH (ref 22–32)
Calcium: 8.7 mg/dL — ABNORMAL LOW (ref 8.9–10.3)
Creatinine, Ser: 0.56 mg/dL (ref 0.44–1.00)
GFR calc Af Amer: >60 mL/min (ref >60)
Glucose, Bld: 149 mg/dL — ABNORMAL HIGH (ref 70–99)
Potassium: 4.4 mmol/L (ref 3.5–5.1)
SODIUM: 142 mmol/L (ref 135–145)
Total Bilirubin: 1.4 mg/dL — ABNORMAL HIGH (ref 0.3–1.2)
Total Protein: 4.8 g/dL — ABNORMAL LOW (ref 6.5–8.1)

## 2018-05-29 LAB — CBC
HCT: 32.2 % — ABNORMAL LOW (ref 36.0–46.0)
Hemoglobin: 9.8 g/dL — ABNORMAL LOW (ref 12.0–15.0)
MCH: 33 pg (ref 26.0–34.0)
MCHC: 30.4 g/dL (ref 30.0–36.0)
MCV: 108.4 fL — AB (ref 78.0–100.0)
PLATELETS: 74 10*3/uL — AB (ref 150–400)
RBC: 2.97 MIL/uL — ABNORMAL LOW (ref 3.87–5.11)
RDW: 17.2 % — ABNORMAL HIGH (ref 11.5–15.5)
WBC: 6.1 10*3/uL (ref 4.0–10.5)

## 2018-05-29 LAB — PROTIME-INR
INR: 2.08
PROTHROMBIN TIME: 23.2 s — AB (ref 11.4–15.2)

## 2018-05-30 LAB — CBC
HCT: 33.9 % — ABNORMAL LOW (ref 36.0–46.0)
Hemoglobin: 10.5 g/dL — ABNORMAL LOW (ref 12.0–15.0)
MCH: 32.9 pg (ref 26.0–34.0)
MCHC: 31 g/dL (ref 30.0–36.0)
MCV: 106.3 fL — ABNORMAL HIGH (ref 78.0–100.0)
PLATELETS: 79 10*3/uL — AB (ref 150–400)
RBC: 3.19 MIL/uL — ABNORMAL LOW (ref 3.87–5.11)
RDW: 17.2 % — ABNORMAL HIGH (ref 11.5–15.5)
WBC: 7.1 10*3/uL (ref 4.0–10.5)

## 2018-05-30 LAB — PROTIME-INR
INR: 1.92
PROTHROMBIN TIME: 21.8 s — AB (ref 11.4–15.2)

## 2018-05-31 LAB — CBC
HEMATOCRIT: 35 % — AB (ref 36.0–46.0)
HEMOGLOBIN: 10.6 g/dL — AB (ref 12.0–15.0)
MCH: 32.6 pg (ref 26.0–34.0)
MCHC: 30.3 g/dL (ref 30.0–36.0)
MCV: 107.7 fL — AB (ref 78.0–100.0)
Platelets: 93 10*3/uL — ABNORMAL LOW (ref 150–400)
RBC: 3.25 MIL/uL — ABNORMAL LOW (ref 3.87–5.11)
RDW: 17.3 % — ABNORMAL HIGH (ref 11.5–15.5)
WBC: 8.4 10*3/uL (ref 4.0–10.5)

## 2018-05-31 LAB — URINALYSIS, ROUTINE W REFLEX MICROSCOPIC
Bilirubin Urine: NEGATIVE
GLUCOSE, UA: NEGATIVE mg/dL
Ketones, ur: NEGATIVE mg/dL
NITRITE: NEGATIVE
PROTEIN: 30 mg/dL — AB
SPECIFIC GRAVITY, URINE: 1.012 (ref 1.005–1.030)
pH: 6 (ref 5.0–8.0)

## 2018-05-31 LAB — PROTIME-INR
INR: 1.56
Prothrombin Time: 18.6 seconds — ABNORMAL HIGH (ref 11.4–15.2)

## 2018-06-01 LAB — CBC
HCT: 33.1 % — ABNORMAL LOW (ref 36.0–46.0)
Hemoglobin: 10.1 g/dL — ABNORMAL LOW (ref 12.0–15.0)
MCH: 32.9 pg (ref 26.0–34.0)
MCHC: 30.5 g/dL (ref 30.0–36.0)
MCV: 107.8 fL — AB (ref 78.0–100.0)
Platelets: 97 10*3/uL — ABNORMAL LOW (ref 150–400)
RBC: 3.07 MIL/uL — ABNORMAL LOW (ref 3.87–5.11)
RDW: 17.4 % — AB (ref 11.5–15.5)
WBC: 7.1 10*3/uL (ref 4.0–10.5)

## 2018-06-01 LAB — PROTIME-INR
INR: 1.31
PROTHROMBIN TIME: 16.2 s — AB (ref 11.4–15.2)

## 2018-06-02 LAB — CBC
HEMATOCRIT: 30.8 % — AB (ref 36.0–46.0)
HEMOGLOBIN: 9.3 g/dL — AB (ref 12.0–15.0)
MCH: 32.9 pg (ref 26.0–34.0)
MCHC: 30.2 g/dL (ref 30.0–36.0)
MCV: 108.8 fL — AB (ref 78.0–100.0)
Platelets: 87 10*3/uL — ABNORMAL LOW (ref 150–400)
RBC: 2.83 MIL/uL — ABNORMAL LOW (ref 3.87–5.11)
RDW: 17.7 % — AB (ref 11.5–15.5)
WBC: 7.6 10*3/uL (ref 4.0–10.5)

## 2018-06-02 LAB — PROTIME-INR
INR: 1.16
Prothrombin Time: 14.7 seconds (ref 11.4–15.2)

## 2018-06-03 LAB — PROTIME-INR
INR: 1.33
Prothrombin Time: 16.4 seconds — ABNORMAL HIGH (ref 11.4–15.2)

## 2018-10-23 IMAGING — DX DG CHEST 1V PORT
1 series · 1 of 1 positions shown · non-contrast
Comparison: 02/01/2017

CLINICAL DATA: Respiratory failure.

EXAM:
PORTABLE CHEST 1 VIEW

[chest]
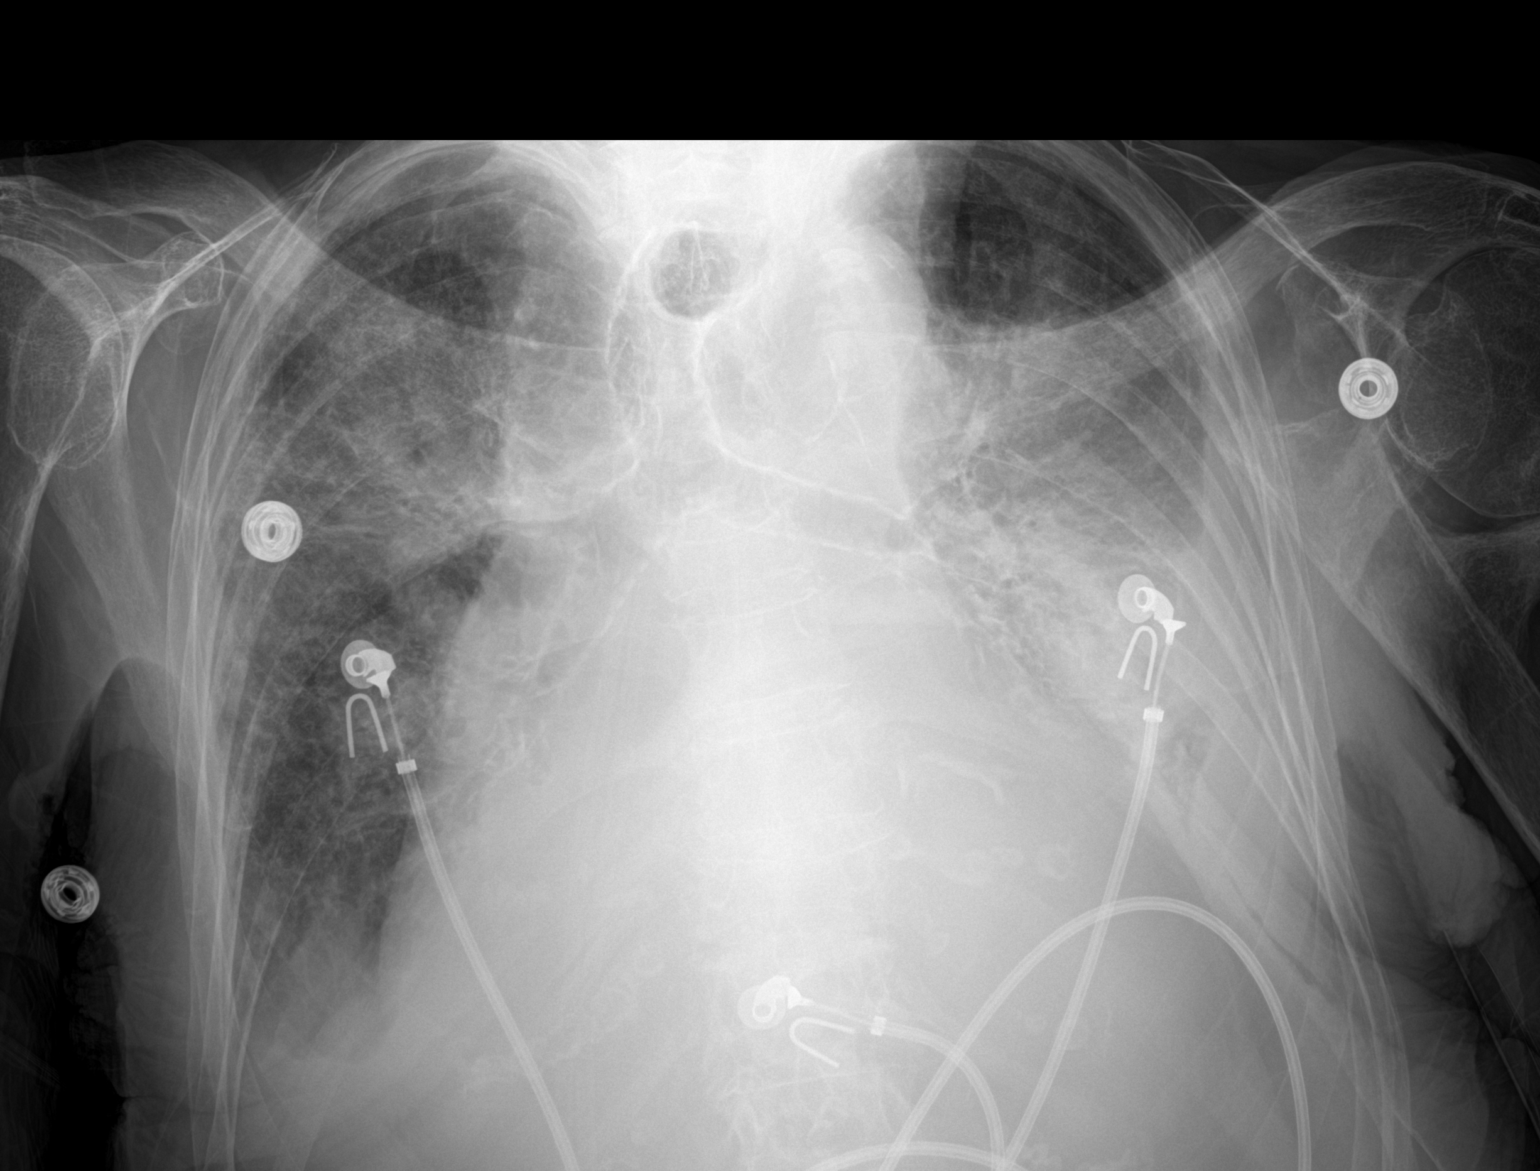

[1 of 1 positions shown; findings below may reference images not displayed]

FINDINGS: Lungs are adequately inflated demonstrate persistent bilateral
central airspace opacification as well as bibasilar opacification
left worse than right. Stable cardiomegaly. Remainder of the exam is
unchanged.
IMPRESSION: Stable bilateral central airspace process and bibasilar
opacification which may be due to multifocal pneumonia versus
moderate interstitial edema with moderate size left effusion and
small right effusion.

Moderate stable cardiomegaly.

## 2018-10-25 IMAGING — DX DG CHEST 1V PORT
1 series · 1 of 1 positions shown · non-contrast
Comparison: 05/15/2018, 02/01/2018, 01/29/2018

CLINICAL DATA: [AGE] female with a history of shortness of
breath

EXAM:
PORTABLE CHEST 1 VIEW

[chest]
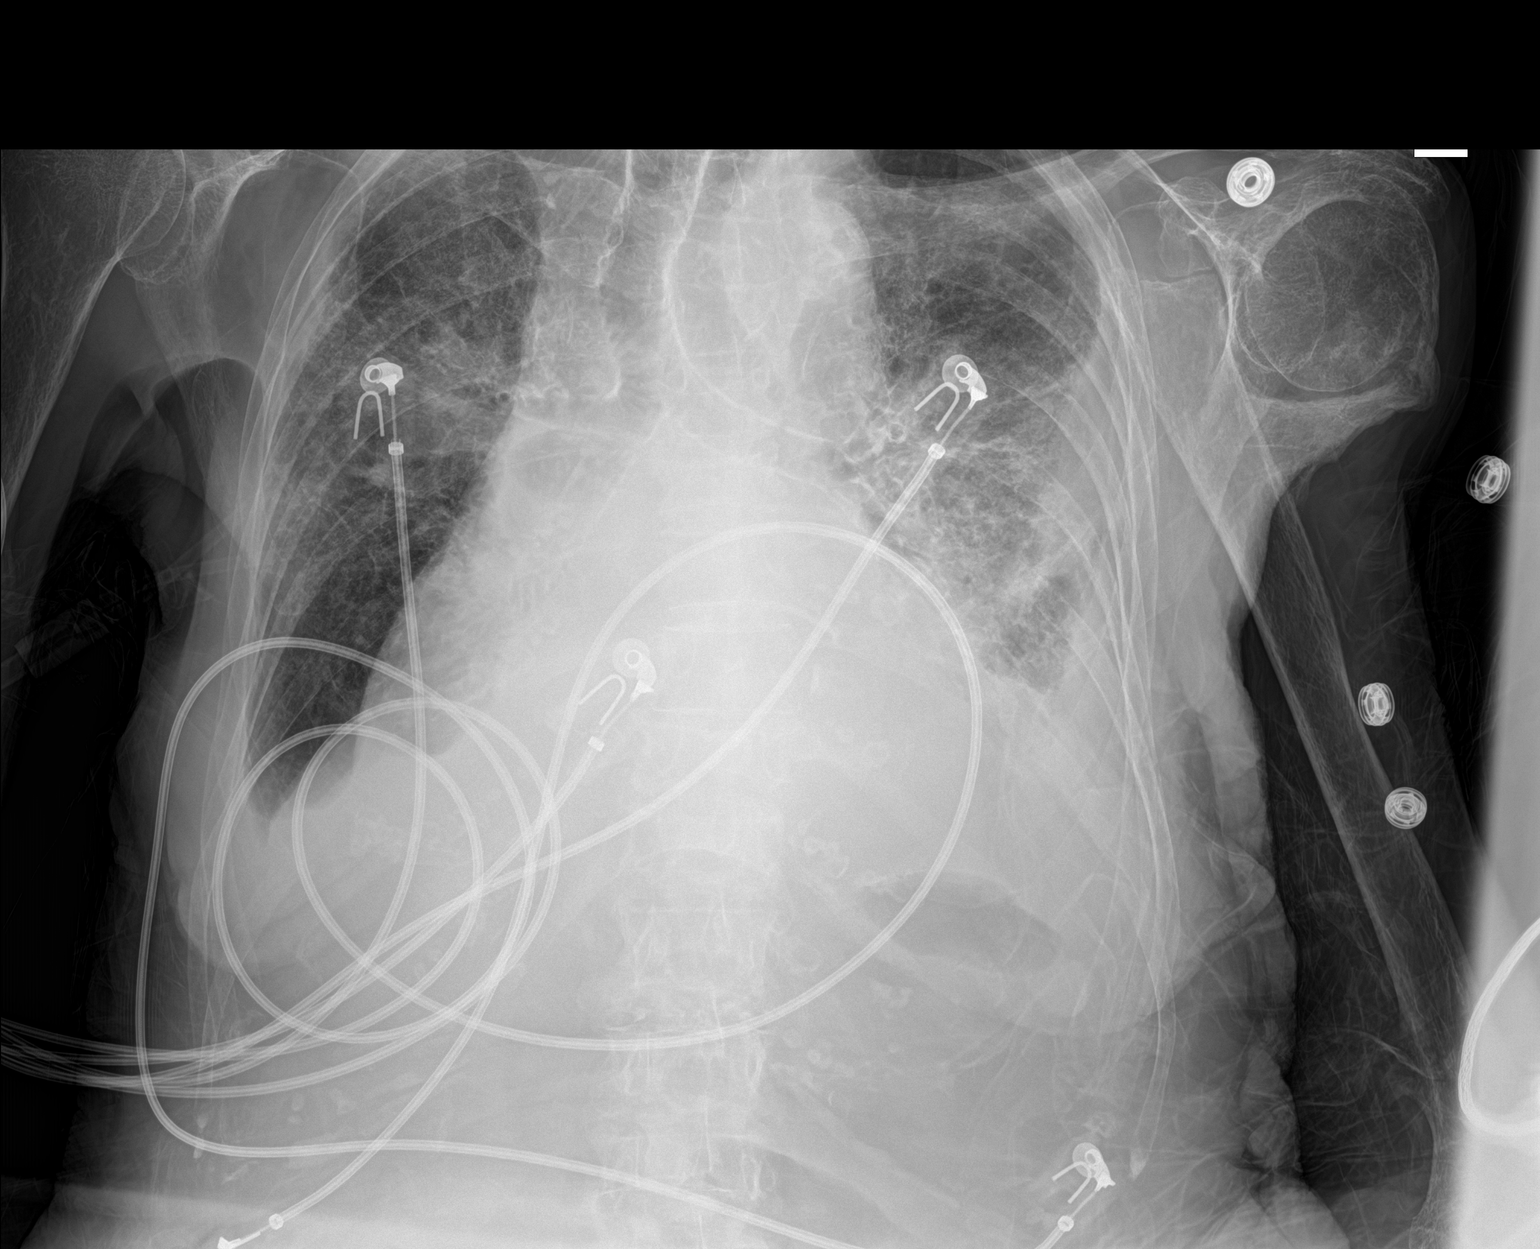

[1 of 1 positions shown; findings below may reference images not displayed]

FINDINGS: Cardiomediastinal silhouette is unchanged in size and contour.
Similar appearance of opacities at the bilateral lung bases with
obscuration of the hemidiaphragms, blunting of the cardiophrenic
angle and costophrenic angles. Opacity is greatest on the left where
there is pleuroparenchymal thickening along the lateral. Mixed
interstitial and airspace opacities of the mid lungs, slightly
improved from the comparison.

Osteopenia.

Chronic nonunion/displaced fracture of the left humerus.
Degenerative changes of the shoulders.
IMPRESSION: Similar appearance of left greater than right basilar opacities,
likely a combination of edema, pleural effusions,
atelectasis/consolidation.

Chronic nonunion displaced fracture of the left humerus.

## 2018-10-30 IMAGING — DX DG CHEST 1V PORT
1 series · 1 of 1 positions shown · non-contrast
Comparison: 05/17/2018

CLINICAL DATA: Respiratory failure.

EXAM:
PORTABLE CHEST 1 VIEW

[chest]
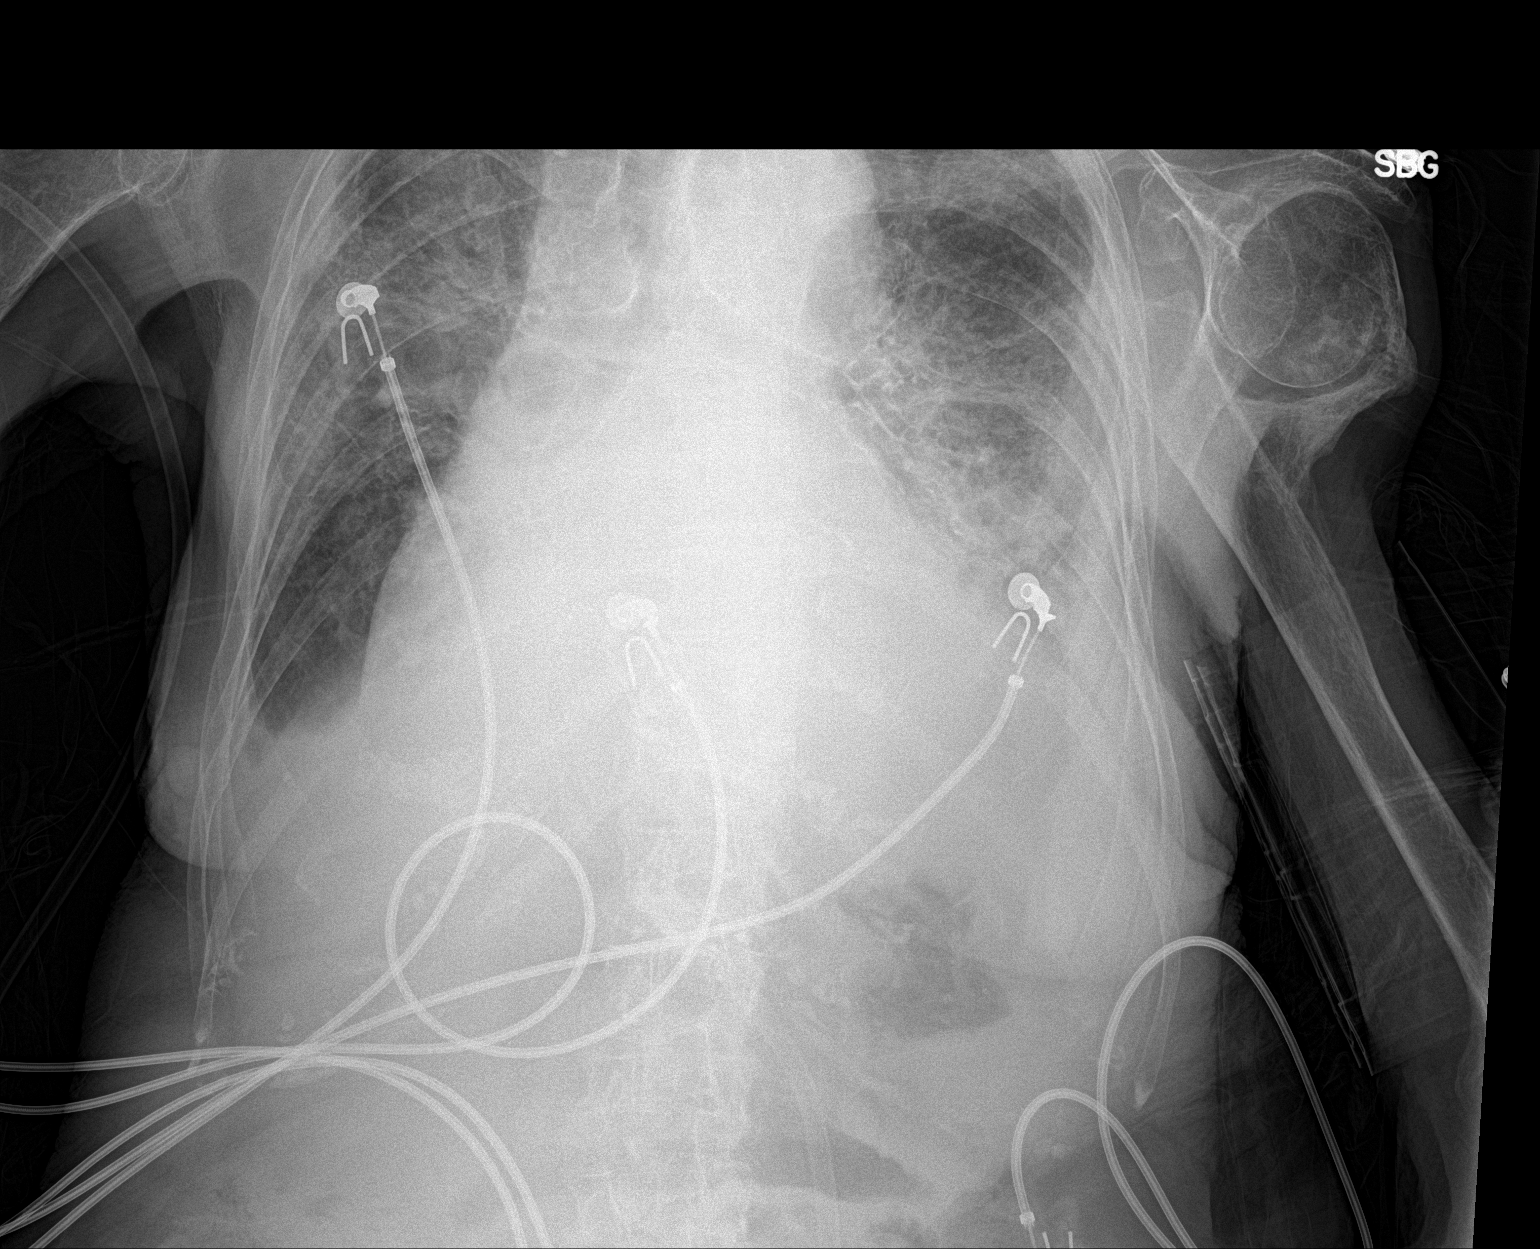

[1 of 1 positions shown; findings below may reference images not displayed]

FINDINGS: Stable cardiac enlargement. Stable airspace disease likely
representing pulmonary edema. Stable left pleural effusion which may
be partially loculated. Small right pleural effusion.
IMPRESSION: Stable cardiac enlargement, pulmonary edema and bilateral pleural
effusions. The left pleural effusion may be partially loculated.
